# Patient Record
Sex: Male | Born: 1980 | Marital: Married | Smoking: Former smoker
Health system: Southern US, Community
[De-identification: ages and names within clinical notes are randomized; demographics above are authoritative.]

## PROBLEM LIST (undated history)

## (undated) DIAGNOSIS — S12300A Unspecified displaced fracture of fourth cervical vertebra, initial encounter for closed fracture: Secondary | ICD-10-CM

## (undated) DIAGNOSIS — S7291XA Unspecified fracture of right femur, initial encounter for closed fracture: Secondary | ICD-10-CM

## (undated) DIAGNOSIS — S12400A Unspecified displaced fracture of fifth cervical vertebra, initial encounter for closed fracture: Secondary | ICD-10-CM

---

## 2011-02-14 ENCOUNTER — Emergency Department (HOSPITAL_COMMUNITY)
Admission: EM | Admit: 2011-02-14 | Discharge: 2011-02-15 | Disposition: A | Discharge status: Home / Self Care | Payer: Self-pay | Attending: Emergency Medicine | Admitting: Emergency Medicine

## 2011-02-14 DIAGNOSIS — L02419 Cutaneous abscess of limb, unspecified: Secondary | ICD-10-CM | POA: Insufficient documentation

## 2011-02-14 DIAGNOSIS — L03119 Cellulitis of unspecified part of limb: Secondary | ICD-10-CM | POA: Insufficient documentation

## 2011-05-31 ENCOUNTER — Encounter: Payer: Self-pay | Admitting: Emergency Medicine

## 2011-05-31 ENCOUNTER — Emergency Department (INDEPENDENT_AMBULATORY_CARE_PROVIDER_SITE_OTHER)
Admission: EM | Admit: 2011-05-31 | Discharge: 2011-05-31 | Disposition: A | Discharge status: Home / Self Care | Payer: Self-pay | Source: Home / Self Care | Attending: Emergency Medicine | Admitting: Emergency Medicine

## 2011-05-31 DIAGNOSIS — L0291 Cutaneous abscess, unspecified: Secondary | ICD-10-CM

## 2011-05-31 DIAGNOSIS — L0390 Cellulitis, unspecified: Secondary | ICD-10-CM

## 2011-05-31 MED ORDER — SULFAMETHOXAZOLE-TMP DS 800-160 MG PO TABS: 2.0000 | ORAL_TABLET | Freq: Two times a day (BID) | ORAL | Status: AC

## 2011-05-31 MED ORDER — MUPIROCIN 2 % EX OINT: TOPICAL_OINTMENT | Freq: Three times a day (TID) | CUTANEOUS | Status: AC

## 2011-05-31 NOTE — ED Notes (Signed)
Pt here with cellulitis of left forearm with itching after poss spider bite last Friday.redness and tenderness seen c/o 2/10 achy pain with bending only

## 2011-05-31 NOTE — ED Provider Notes (Signed)
History     CSN: [id number] Arrival date & time: 05/31/2011  8:19 PM   First MD Initiated Contact with Patient 05/31/11 2015      Chief Complaint  Patient presents with  . Insect Bite  . Cellulitis    (Consider location/radiation/quality/duration/timing/severity/associated sxs/prior treatment) HPI Comments: Mr. Winningham has had a four-day history of a painful, red, swollen nodule just above his elbow. This is tender to touch. It has not drained any pus. He denies any fever or chills. He denies any lesions elsewhere. He had a similar lesion on his left thigh about 2 months ago. Your friend also had a similar lesion as well. He's not sure if he had MRSA or not.   History reviewed. No pertinent past medical history.  History reviewed. No pertinent past surgical history.  History reviewed. No pertinent family history.  History  Substance Use Topics  . Smoking status: Current Everyday Smoker  . Smokeless tobacco: Not on file  . Alcohol Use: Yes      Review of Systems  Constitutional: Negative for fever and chills.  Skin: Negative for color change, pallor, rash and wound.    Allergies  Review of patient's allergies indicates not on file.  Home Medications   Current Outpatient Rx  Name Route Sig Dispense Refill  . MUPIROCIN 2 % EX OINT Topical Apply topically 3 (three) times daily. 22 g 0  . SULFAMETHOXAZOLE-TMP DS 800-160 MG PO TABS Oral Take 2 tablets by mouth 2 (two) times daily. 40 tablet 0    BP 162/96  Pulse 76  Temp(Src) 98.5 F (36.9 C) (Oral)  Resp 18  SpO2 100%  Physical Exam  Nursing note and vitals reviewed. Constitutional: He appears well-developed and well-nourished. No distress.  Skin: Skin is warm and dry. No abrasion, no bruising, no ecchymosis, no lesion and no rash noted. He is not diaphoretic. No erythema. No pallor.       Just proximal to the elbow there was a 1 cm, tender, raised nodule with a small central ulceration. There was no fluctuance  or purulent drainage.    ED Course  Procedures (including critical care time)  Labs Reviewed - No data to display No results found.   1. Cellulitis       MDM  This appears to be a MRSA infection. He probably had one on his thigh previously. In addition his girlfriend may have the same thing. He'll be treated with a ten-day course of Bactrim, 2 tablets twice daily and mupirocin ointment to be applied to the lesion until clear and to both nostrils twice daily for a month.        Roque Lias, MD 05/31/11 [phone]

## 2012-06-21 DIAGNOSIS — S7291XA Unspecified fracture of right femur, initial encounter for closed fracture: Secondary | ICD-10-CM

## 2012-06-21 HISTORY — DX: Unspecified fracture of right femur, initial encounter for closed fracture: S72.91XA

## 2013-03-27 ENCOUNTER — Emergency Department (HOSPITAL_COMMUNITY): Payer: No Typology Code available for payment source

## 2013-03-27 ENCOUNTER — Inpatient Hospital Stay (HOSPITAL_COMMUNITY)
Admission: EM | Admit: 2013-03-27 | Discharge: 2013-03-30 | DRG: 481 | Disposition: A | Discharge status: Home / Self Care | Payer: No Typology Code available for payment source | Attending: Orthopedic Surgery | Admitting: Orthopedic Surgery

## 2013-03-27 ENCOUNTER — Inpatient Hospital Stay (HOSPITAL_COMMUNITY): Payer: No Typology Code available for payment source

## 2013-03-27 ENCOUNTER — Encounter (HOSPITAL_COMMUNITY): Payer: Self-pay | Admitting: Certified Registered"

## 2013-03-27 ENCOUNTER — Encounter (HOSPITAL_COMMUNITY): Payer: Self-pay | Admitting: Cardiology

## 2013-03-27 ENCOUNTER — Inpatient Hospital Stay (HOSPITAL_COMMUNITY): Payer: No Typology Code available for payment source | Admitting: Certified Registered"

## 2013-03-27 ENCOUNTER — Encounter (HOSPITAL_COMMUNITY): Admission: EM | Disposition: A | Discharge status: Home / Self Care | Payer: Self-pay | Source: Home / Self Care

## 2013-03-27 DIAGNOSIS — N189 Chronic kidney disease, unspecified: Secondary | ICD-10-CM | POA: Diagnosis present

## 2013-03-27 DIAGNOSIS — S72309A Unspecified fracture of shaft of unspecified femur, initial encounter for closed fracture: Principal | ICD-10-CM | POA: Diagnosis present

## 2013-03-27 DIAGNOSIS — D62 Acute posthemorrhagic anemia: Secondary | ICD-10-CM | POA: Diagnosis not present

## 2013-03-27 DIAGNOSIS — K5900 Constipation, unspecified: Secondary | ICD-10-CM | POA: Diagnosis not present

## 2013-03-27 DIAGNOSIS — S12400A Unspecified displaced fracture of fifth cervical vertebra, initial encounter for closed fracture: Secondary | ICD-10-CM

## 2013-03-27 DIAGNOSIS — F17200 Nicotine dependence, unspecified, uncomplicated: Secondary | ICD-10-CM | POA: Diagnosis present

## 2013-03-27 DIAGNOSIS — N182 Chronic kidney disease, stage 2 (mild): Secondary | ICD-10-CM | POA: Diagnosis present

## 2013-03-27 DIAGNOSIS — S12300A Unspecified displaced fracture of fourth cervical vertebra, initial encounter for closed fracture: Secondary | ICD-10-CM | POA: Diagnosis present

## 2013-03-27 DIAGNOSIS — S7291XA Unspecified fracture of right femur, initial encounter for closed fracture: Secondary | ICD-10-CM

## 2013-03-27 DIAGNOSIS — S7290XA Unspecified fracture of unspecified femur, initial encounter for closed fracture: Secondary | ICD-10-CM

## 2013-03-27 DIAGNOSIS — S129XXA Fracture of neck, unspecified, initial encounter: Secondary | ICD-10-CM

## 2013-03-27 HISTORY — PX: FEMUR IM NAIL: SHX1597

## 2013-03-27 HISTORY — DX: Unspecified displaced fracture of fourth cervical vertebra, initial encounter for closed fracture: S12.300A

## 2013-03-27 HISTORY — DX: Unspecified displaced fracture of fifth cervical vertebra, initial encounter for closed fracture: S12.400A

## 2013-03-27 LAB — URINALYSIS, ROUTINE W REFLEX MICROSCOPIC
Glucose, UA: NEGATIVE mg/dL
Leukocytes, UA: NEGATIVE
Nitrite: NEGATIVE
Protein, ur: NEGATIVE mg/dL
pH: 6.5 (ref 5.0–8.0)

## 2013-03-27 LAB — COMPREHENSIVE METABOLIC PANEL
ALT: 19 U/L (ref 0–53)
AST: 24 U/L (ref 0–37)
Albumin: 3.7 g/dL (ref 3.5–5.2)
Calcium: 8.6 mg/dL (ref 8.4–10.5)
Sodium: 136 mEq/L (ref 135–145)
Total Protein: 7.2 g/dL (ref 6.0–8.3)

## 2013-03-27 LAB — PROTIME-INR: INR: 1 (ref 0.00–1.49)

## 2013-03-27 LAB — CBC
HCT: 45.7 % (ref 39.0–52.0)
Hemoglobin: 15.8 g/dL (ref 13.0–17.0)
MCH: 30.7 pg (ref 26.0–34.0)
MCHC: 34.6 g/dL (ref 30.0–36.0)
RBC: 5.14 MIL/uL (ref 4.22–5.81)

## 2013-03-27 LAB — POCT I-STAT, CHEM 8
Calcium, Ion: 1.16 mmol/L (ref 1.12–1.23)
Chloride: 104 mEq/L (ref 96–112)
Glucose, Bld: 115 mg/dL — ABNORMAL HIGH (ref 70–99)
HCT: 52 % (ref 39.0–52.0)
Hemoglobin: 17.7 g/dL — ABNORMAL HIGH (ref 13.0–17.0)

## 2013-03-27 LAB — MRSA PCR SCREENING: MRSA by PCR: NEGATIVE

## 2013-03-27 LAB — SAMPLE TO BLOOD BANK

## 2013-03-27 LAB — CG4 I-STAT (LACTIC ACID): Lactic Acid, Venous: 2.14 mmol/L (ref 0.5–2.2)

## 2013-03-27 SURGERY — INSERTION, INTRAMEDULLARY ROD, FEMUR
Anesthesia: General | Site: Leg Upper | Laterality: Right | Wound class: Clean

## 2013-03-27 MED ORDER — ACETAMINOPHEN 325 MG PO TABS
650.0000 mg | ORAL_TABLET | Freq: Four times a day (QID) | ORAL | Status: DC | PRN
  Administered 2013-03-28: 650 mg via ORAL
  Filled 2013-03-27: qty 2

## 2013-03-27 MED ORDER — HYDROMORPHONE HCL PF 1 MG/ML IJ SOLN
1.0000 mg | Freq: Once | INTRAMUSCULAR | Status: AC
  Administered 2013-03-27: 1 mg via INTRAVENOUS
  Filled 2013-03-27: qty 1

## 2013-03-27 MED ORDER — CHLORHEXIDINE GLUCONATE 4 % EX LIQD
60.0000 mL | Freq: Once | CUTANEOUS | Status: DC
  Filled 2013-03-27: qty 60

## 2013-03-27 MED ORDER — DOCUSATE SODIUM 100 MG PO CAPS
100.0000 mg | ORAL_CAPSULE | Freq: Two times a day (BID) | ORAL | Status: DC
  Administered 2013-03-28 – 2013-03-30 (×4): 100 mg via ORAL
  Filled 2013-03-27 (×7): qty 1

## 2013-03-27 MED ORDER — ACETAMINOPHEN 650 MG RE SUPP: 650.0000 mg | Freq: Four times a day (QID) | RECTAL | Status: DC | PRN

## 2013-03-27 MED ORDER — PROPOFOL 10 MG/ML IV BOLUS
INTRAVENOUS | Status: DC | PRN
  Administered 2013-03-27: 200 mg via INTRAVENOUS

## 2013-03-27 MED ORDER — CEFAZOLIN SODIUM 1-5 GM-% IV SOLN
INTRAVENOUS | Status: AC
  Filled 2013-03-27: qty 50

## 2013-03-27 MED ORDER — FENTANYL CITRATE 0.05 MG/ML IJ SOLN
INTRAMUSCULAR | Status: DC | PRN
  Administered 2013-03-27: 100 ug via INTRAVENOUS
  Administered 2013-03-27: 50 ug via INTRAVENOUS
  Administered 2013-03-27: 100 ug via INTRAVENOUS
  Administered 2013-03-27: 50 ug via INTRAVENOUS
  Administered 2013-03-27: 100 ug via INTRAVENOUS

## 2013-03-27 MED ORDER — SODIUM CHLORIDE 0.9 % IV BOLUS (SEPSIS)
1000.0000 mL | Freq: Once | INTRAVENOUS | Status: DC
  Administered 2013-03-27: 1000 mL via INTRAVENOUS

## 2013-03-27 MED ORDER — LIDOCAINE HCL (CARDIAC) 20 MG/ML IV SOLN
INTRAVENOUS | Status: DC | PRN
  Administered 2013-03-27: 80 mg via INTRAVENOUS

## 2013-03-27 MED ORDER — PANTOPRAZOLE SODIUM 40 MG IV SOLR
40.0000 mg | Freq: Every day | INTRAVENOUS | Status: DC
  Filled 2013-03-27 (×2): qty 40

## 2013-03-27 MED ORDER — POTASSIUM CHLORIDE IN NACL 20-0.45 MEQ/L-% IV SOLN
INTRAVENOUS | Status: DC
  Administered 2013-03-27 – 2013-03-28 (×2): via INTRAVENOUS
  Filled 2013-03-27 (×7): qty 1000

## 2013-03-27 MED ORDER — ONDANSETRON HCL 4 MG PO TABS: 4.0000 mg | ORAL_TABLET | Freq: Four times a day (QID) | ORAL | Status: DC | PRN

## 2013-03-27 MED ORDER — FENTANYL CITRATE 0.05 MG/ML IJ SOLN
INTRAMUSCULAR | Status: AC
  Filled 2013-03-27: qty 2

## 2013-03-27 MED ORDER — OXYCODONE HCL 5 MG PO TABS
5.0000 mg | ORAL_TABLET | ORAL | Status: DC | PRN
  Administered 2013-03-28: 10 mg via ORAL
  Filled 2013-03-27 (×2): qty 2

## 2013-03-27 MED ORDER — 0.9 % SODIUM CHLORIDE (POUR BTL) OPTIME
TOPICAL | Status: DC | PRN
  Administered 2013-03-27: 1000 mL

## 2013-03-27 MED ORDER — HYDROMORPHONE HCL PF 1 MG/ML IJ SOLN
INTRAMUSCULAR | Status: AC
  Filled 2013-03-27: qty 1

## 2013-03-27 MED ORDER — TEMAZEPAM 15 MG PO CAPS: 15.0000 mg | ORAL_CAPSULE | Freq: Every evening | ORAL | Status: DC | PRN

## 2013-03-27 MED ORDER — OXYCODONE HCL 5 MG PO TABS
5.0000 mg | ORAL_TABLET | ORAL | Status: DC | PRN
  Administered 2013-03-28 – 2013-03-29 (×2): 10 mg via ORAL
  Filled 2013-03-27: qty 2

## 2013-03-27 MED ORDER — METHOCARBAMOL 500 MG PO TABS
500.0000 mg | ORAL_TABLET | Freq: Four times a day (QID) | ORAL | Status: DC | PRN
  Administered 2013-03-29 – 2013-03-30 (×3): 500 mg via ORAL
  Filled 2013-03-27 (×3): qty 1

## 2013-03-27 MED ORDER — PHENOL 1.4 % MT LIQD: 1.0000 | OROMUCOSAL | Status: DC | PRN

## 2013-03-27 MED ORDER — OXYCODONE HCL 5 MG/5ML PO SOLN: 5.0000 mg | Freq: Once | ORAL | Status: DC | PRN

## 2013-03-27 MED ORDER — TETANUS-DIPHTH-ACELL PERTUSSIS 5-2.5-18.5 LF-MCG/0.5 IM SUSP
0.5000 mL | Freq: Once | INTRAMUSCULAR | Status: AC
  Administered 2013-03-27: 0.5 mL via INTRAMUSCULAR
  Filled 2013-03-27: qty 0.5

## 2013-03-27 MED ORDER — MENTHOL 3 MG MT LOZG: 1.0000 | LOZENGE | OROMUCOSAL | Status: DC | PRN

## 2013-03-27 MED ORDER — SODIUM CHLORIDE 0.9 % IV SOLN: Freq: Once | INTRAVENOUS | Status: DC

## 2013-03-27 MED ORDER — ONDANSETRON HCL 4 MG/2ML IJ SOLN
4.0000 mg | Freq: Four times a day (QID) | INTRAMUSCULAR | Status: DC | PRN
  Administered 2013-03-30: 4 mg via INTRAVENOUS
  Filled 2013-03-27 (×2): qty 2

## 2013-03-27 MED ORDER — DEXTROSE 5 % IV SOLN
3.0000 g | INTRAVENOUS | Status: DC
  Filled 2013-03-27: qty 3000

## 2013-03-27 MED ORDER — METOCLOPRAMIDE HCL 5 MG PO TABS
5.0000 mg | ORAL_TABLET | Freq: Three times a day (TID) | ORAL | Status: DC | PRN
  Filled 2013-03-27: qty 2

## 2013-03-27 MED ORDER — OXYCODONE HCL 5 MG PO TABS
ORAL_TABLET | ORAL | Status: AC
  Administered 2013-03-27: 5 mg
  Filled 2013-03-27: qty 1

## 2013-03-27 MED ORDER — OXYCODONE HCL 5 MG PO TABS: 5.0000 mg | ORAL_TABLET | Freq: Once | ORAL | Status: DC | PRN

## 2013-03-27 MED ORDER — ONDANSETRON HCL 4 MG/2ML IJ SOLN: 4.0000 mg | Freq: Four times a day (QID) | INTRAMUSCULAR | Status: DC | PRN

## 2013-03-27 MED ORDER — PANTOPRAZOLE SODIUM 40 MG PO TBEC
40.0000 mg | DELAYED_RELEASE_TABLET | Freq: Every day | ORAL | Status: DC
  Administered 2013-03-28: 40 mg via ORAL
  Filled 2013-03-27: qty 1

## 2013-03-27 MED ORDER — ONDANSETRON HCL 4 MG/2ML IJ SOLN
INTRAMUSCULAR | Status: DC | PRN
  Administered 2013-03-27: 4 mg via INTRAMUSCULAR

## 2013-03-27 MED ORDER — ENOXAPARIN SODIUM 30 MG/0.3ML ~~LOC~~ SOLN: 30.0000 mg | Freq: Two times a day (BID) | SUBCUTANEOUS | Status: DC

## 2013-03-27 MED ORDER — HYDROMORPHONE HCL PF 1 MG/ML IJ SOLN
0.2500 mg | INTRAMUSCULAR | Status: DC | PRN
  Administered 2013-03-27 (×2): 0.5 mg via INTRAVENOUS

## 2013-03-27 MED ORDER — METHOCARBAMOL 100 MG/ML IJ SOLN
500.0000 mg | Freq: Four times a day (QID) | INTRAVENOUS | Status: DC | PRN
  Filled 2013-03-27: qty 5

## 2013-03-27 MED ORDER — IOHEXOL 350 MG/ML SOLN
50.0000 mL | Freq: Once | INTRAVENOUS | Status: AC | PRN
  Administered 2013-03-27: 50 mL via INTRAVENOUS

## 2013-03-27 MED ORDER — ASPIRIN EC 325 MG PO TBEC
325.0000 mg | DELAYED_RELEASE_TABLET | Freq: Every day | ORAL | Status: DC
  Administered 2013-03-28 – 2013-03-30 (×3): 325 mg via ORAL
  Filled 2013-03-27 (×4): qty 1

## 2013-03-27 MED ORDER — HYDROMORPHONE HCL PF 1 MG/ML IJ SOLN
0.5000 mg | INTRAMUSCULAR | Status: DC | PRN
Start: 2013-03-27 — End: 2013-03-30
  Administered 2013-03-28 – 2013-03-29 (×2): 0.5 mg via INTRAVENOUS
  Filled 2013-03-27 (×2): qty 1

## 2013-03-27 MED ORDER — CEFAZOLIN SODIUM-DEXTROSE 2-3 GM-% IV SOLR
INTRAVENOUS | Status: AC
  Administered 2013-03-27: 3 g via INTRAVENOUS
  Filled 2013-03-27: qty 50

## 2013-03-27 MED ORDER — SUCCINYLCHOLINE CHLORIDE 20 MG/ML IJ SOLN
INTRAMUSCULAR | Status: DC | PRN
  Administered 2013-03-27: 120 mg via INTRAVENOUS

## 2013-03-27 MED ORDER — POLYETHYLENE GLYCOL 3350 17 G PO PACK
17.0000 g | PACK | Freq: Every day | ORAL | Status: DC
  Administered 2013-03-29 – 2013-03-30 (×2): 17 g via ORAL
  Filled 2013-03-27 (×6): qty 1

## 2013-03-27 MED ORDER — MORPHINE SULFATE 2 MG/ML IJ SOLN
0.5000 mg | INTRAMUSCULAR | Status: DC | PRN
  Administered 2013-03-27 – 2013-03-28 (×2): 0.5 mg via INTRAVENOUS
  Filled 2013-03-27 (×2): qty 1

## 2013-03-27 MED ORDER — FENTANYL CITRATE 0.05 MG/ML IJ SOLN
50.0000 ug | Freq: Once | INTRAMUSCULAR | Status: AC
  Administered 2013-03-27: 50 ug via INTRAVENOUS

## 2013-03-27 MED ORDER — METOCLOPRAMIDE HCL 5 MG/ML IJ SOLN
5.0000 mg | Freq: Three times a day (TID) | INTRAMUSCULAR | Status: DC | PRN
  Filled 2013-03-27: qty 2

## 2013-03-27 MED ORDER — LACTATED RINGERS IV SOLN
INTRAVENOUS | Status: DC | PRN
  Administered 2013-03-27 (×2): via INTRAVENOUS

## 2013-03-27 MED ORDER — MIDAZOLAM HCL 5 MG/5ML IJ SOLN
INTRAMUSCULAR | Status: DC | PRN
  Administered 2013-03-27: 2 mg via INTRAVENOUS

## 2013-03-27 MED ORDER — PROMETHAZINE HCL 25 MG/ML IJ SOLN: 6.2500 mg | INTRAMUSCULAR | Status: DC | PRN

## 2013-03-27 SURGICAL SUPPLY — 42 items
BANDAGE CONFORM 3  STR LF (GAUZE/BANDAGES/DRESSINGS) ×4 IMPLANT
BIT DRILL 3.8X6 NS (BIT) ×1 IMPLANT
BIT DRILL 5.3 NS (BIT) ×1 IMPLANT
BLADE SURG 15 STRL LF DISP TIS (BLADE) ×1 IMPLANT
BLADE SURG 15 STRL SS (BLADE) ×2
BNDG COHESIVE 6X5 TAN STRL LF (GAUZE/BANDAGES/DRESSINGS) ×1 IMPLANT
CLOTH BEACON ORANGE TIMEOUT ST (SAFETY) ×2 IMPLANT
CLSR STERI-STRIP ANTIMIC 1/2X4 (GAUZE/BANDAGES/DRESSINGS) ×1 IMPLANT
DRAPE INCISE IOBAN 66X45 STRL (DRAPES) ×2 IMPLANT
DRAPE PROXIMA HALF (DRAPES) ×1 IMPLANT
DRAPE STERI IOBAN 125X83 (DRAPES) ×2 IMPLANT
DRSG ADAPTIC 3X8 NADH LF (GAUZE/BANDAGES/DRESSINGS) ×2 IMPLANT
DRSG MEPILEX BORDER 4X4 (GAUZE/BANDAGES/DRESSINGS) ×3 IMPLANT
DRSG MEPILEX BORDER 4X8 (GAUZE/BANDAGES/DRESSINGS) ×2 IMPLANT
ELECT REM PT RETURN 9FT ADLT (ELECTROSURGICAL) ×2
ELECTRODE REM PT RTRN 9FT ADLT (ELECTROSURGICAL) ×1 IMPLANT
GLOVE BIO SURGEON STRL SZ7.5 (GLOVE) ×2 IMPLANT
GLOVE BIO SURGEON STRL SZ8 (GLOVE) ×2 IMPLANT
GLOVE EUDERMIC 7 POWDERFREE (GLOVE) ×4 IMPLANT
GLOVE SS BIOGEL STRL SZ 7.5 (GLOVE) ×1 IMPLANT
GLOVE SUPERSENSE BIOGEL SZ 7.5 (GLOVE) ×2
GOWN STRL NON-REIN LRG LVL3 (GOWN DISPOSABLE) ×5 IMPLANT
GOWN STRL REIN XL XLG (GOWN DISPOSABLE) ×4 IMPLANT
GUIDEPIN 3.2X17.5 THRD DISP (PIN) ×1 IMPLANT
GUIDEWIRE BALL NOSE 100CM (WIRE) ×2 IMPLANT
KIT BASIN OR (CUSTOM PROCEDURE TRAY) ×2 IMPLANT
KIT ROOM TURNOVER OR (KITS) ×2 IMPLANT
NAIL VERSANIAL TROCK 9X42 (Nail) ×1 IMPLANT
NS IRRIG 1000ML POUR BTL (IV SOLUTION) ×2 IMPLANT
PACK GENERAL/GYN (CUSTOM PROCEDURE TRAY) ×2 IMPLANT
PAD ARMBOARD 7.5X6 YLW CONV (MISCELLANEOUS) ×4 IMPLANT
SCREW ACE CORTICAL 6.5X70MML (Screw) ×1 IMPLANT
SCREW ACECAP 42MM (Screw) ×1 IMPLANT
SCREWDRIVER HEX TIP 3.5MM (MISCELLANEOUS) ×1 IMPLANT
SPONGE LAP 18X18 X RAY DECT (DISPOSABLE) ×2 IMPLANT
STAPLER VISISTAT 35W (STAPLE) ×4 IMPLANT
STRIP CLOSURE SKIN 1/2X4 (GAUZE/BANDAGES/DRESSINGS) ×2 IMPLANT
SUT MNCRL AB 3-0 PS2 18 (SUTURE) ×2 IMPLANT
SUT VIC AB 0 CT1 27 (SUTURE) ×2
SUT VIC AB 0 CT1 27XBRD ANBCTR (SUTURE) ×1 IMPLANT
SUT VIC AB 2-0 CT1 27 (SUTURE) ×2
SUT VIC AB 2-0 CT1 TAPERPNT 27 (SUTURE) ×1 IMPLANT

## 2013-03-27 NOTE — ED Notes (Signed)
Pulses palpated in the right lower extremity. Cap refill less than 3 seconds.
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
## 2013-03-27 NOTE — ED Notes (Signed)
Pt to CT scanner

## 2013-03-27 NOTE — Anesthesia Procedure Notes (Signed)
Procedure Name: Intubation Date/Time: 03/27/2013 7:45 PM Performed by: Molli Hazard Pre-anesthesia Checklist: Patient identified, Emergency Drugs available, Suction available and Patient being monitored Patient Re-evaluated:Patient Re-evaluated prior to inductionOxygen Delivery Method: Circle system utilized Preoxygenation: Pre-oxygenation with 100% oxygen Intubation Type: IV induction Ventilation: Mask ventilation without difficulty Laryngoscope size: glidescope. Grade View: Grade I Tube type: Oral Tube size: 8.0 mm Number of attempts: 1 Airway Equipment and Method: Stylet and Video-laryngoscopy Placement Confirmation: ETT inserted through vocal cords under direct vision,  positive ETCO2 and breath sounds checked- equal and bilateral Secured at: 22 cm Tube secured with: Tape Dental Injury: Teeth and Oropharynx as per pre-operative assessment  Comments: Front of cervical collar removed for intubation. Head and neck remained neutral throughout. Easily intubated using Glidescope and collar replaced.
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
## 2013-03-27 NOTE — ED Notes (Signed)
Family at beside. Family given emotional support. 

## 2013-03-27 NOTE — Anesthesia Postprocedure Evaluation (Signed)
  Anesthesia Post-op Note  Patient: Hector Wallace  Procedure(s) Performed: Procedure(s): INTRAMEDULLARY (IM) NAIL FEMORAL (Right)  Patient Location: PACU  Anesthesia Type:General  Level of Consciousness: awake  Airway and Oxygen Therapy: Patient Spontanous Breathing  Post-op Pain: mild  Post-op Assessment: Post-op Vital signs reviewed  Post-op Vital Signs: stable  Complications: No apparent anesthesia complications

## 2013-03-27 NOTE — ED Notes (Signed)
Pt at CT

## 2013-03-27 NOTE — Transfer of Care (Signed)
Immediate Anesthesia Transfer of Care Note  Patient: Hector Wallace  Procedure(s) Performed: Procedure(s): INTRAMEDULLARY (IM) NAIL FEMORAL (Right)  Patient Location: PACU  Anesthesia Type:General  Level of Consciousness: awake, sedated and patient cooperative  Airway & Oxygen Therapy: Patient Spontanous Breathing and Patient connected to nasal cannula oxygen  Post-op Assessment: Report given to PACU RN, Post -op Vital signs reviewed and stable and Patient moving all extremities X 4  Post vital signs: Reviewed and stable  Complications: No apparent anesthesia complications
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
## 2013-03-27 NOTE — Op Note (Signed)
03/27/2013  9:39 PM  PATIENT:   Hector Wallace  31 y.o. male  PRE-OPERATIVE DIAGNOSIS:  RIGHT FEMUR FRACTURE  POST-OPERATIVE DIAGNOSIS:  same  PROCEDURE:  Reamed, statically locked right femoral intramedullary nailing  SURGEON:  Safiya Girdler, Vania Rea M.D.  ASSISTANTS: [place]ord pac   ANESTHESIA:   GET  EBL: 300  SPECIMEN:  none  Drains: none   PATIENT DISPOSITION:  PACU - hemodynamically stable.    PLAN OF CARE: Admit to inpatient   Dictation# [id number], [phone]

## 2013-03-27 NOTE — ED Notes (Signed)
Aspen Collar applied with Dr. Littie Deeds at the bedside. Family informed of pt status. Waiting for consulting providers.

## 2013-03-27 NOTE — ED Notes (Signed)
Pt c/o pain where traction strap was touching right medial foot. 4x4 placed between foot and buckle/strap of traction device, pt states foot feels much better at this time.

## 2013-03-27 NOTE — ED Notes (Addendum)
X-ray at the bedside. Pt given warm blankets.
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
## 2013-03-27 NOTE — Anesthesia Preprocedure Evaluation (Addendum)
Anesthesia Evaluation  Patient identified by MRN, date of birth, ID band Patient awake    Reviewed: Allergy & Precautions, H&P , NPO status , Patient's Chart, lab work & pertinent test results  History of Anesthesia Complications Negative for: history of anesthetic complications  Airway Mallampati: I  Neck ROM: Limited   Comment: In cervical collar Dental  (+) Teeth Intact and Dental Advisory Given   Pulmonary Current Smoker,  breath sounds clear to auscultation        Cardiovascular Rhythm:Regular Rate:Normal     Neuro/Psych Noted cerv spine CT and C4-C5 laminae fx,can extend head slightly    GI/Hepatic   Endo/Other    Renal/GU      Musculoskeletal   Abdominal   Peds  Hematology   Anesthesia Other Findings   Reproductive/Obstetrics                          Anesthesia Physical Anesthesia Plan  ASA: II and emergent  Anesthesia Plan: General   Post-op Pain Management:    Induction: Intravenous, Rapid sequence and Cricoid pressure planned  Airway Management Planned: Oral ETT and Video Laryngoscope Planned  Additional Equipment:   Intra-op Plan:   Post-operative Plan: Extubation in OR  Informed Consent:   Dental advisory given  Plan Discussed with: Anesthesiologist and Surgeon  Anesthesia Plan Comments:         Anesthesia Quick Evaluation

## 2013-03-27 NOTE — H&P (Signed)
Pt seen and examined agree with above
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
## 2013-03-27 NOTE — ED Notes (Addendum)
Ortho PA at the bedside 

## 2013-03-27 NOTE — ED Notes (Signed)
CT contacted regarding delay in imaging.
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
## 2013-03-27 NOTE — Preoperative (Signed)
Beta Blockers   Reason not to administer Beta Blockers:Not Applicable 

## 2013-03-28 DIAGNOSIS — N289 Disorder of kidney and ureter, unspecified: Secondary | ICD-10-CM

## 2013-03-28 LAB — BASIC METABOLIC PANEL
BUN: 11 mg/dL (ref 6–23)
CO2: 24 mEq/L (ref 19–32)
Chloride: 104 mEq/L (ref 96–112)
Creatinine, Ser: 1.39 mg/dL — ABNORMAL HIGH (ref 0.50–1.35)
GFR calc Af Amer: 75 mL/min — ABNORMAL LOW (ref >90)
GFR calc non Af Amer: 65 mL/min — ABNORMAL LOW (ref >90)
Potassium: 4.1 mEq/L (ref 3.5–5.1)
Sodium: 137 mEq/L (ref 135–145)

## 2013-03-28 LAB — CBC
HCT: 38.2 % — ABNORMAL LOW (ref 39.0–52.0)
Hemoglobin: 14.1 g/dL (ref 13.0–17.0)
MCH: 32.6 pg (ref 26.0–34.0)
MCHC: 36.9 g/dL — ABNORMAL HIGH (ref 30.0–36.0)
MCV: 88.4 fL (ref 78.0–100.0)
RBC: 4.32 MIL/uL (ref 4.22–5.81)
WBC: 10.1 10*3/uL (ref 4.0–10.5)

## 2013-03-28 LAB — URINALYSIS, ROUTINE W REFLEX MICROSCOPIC
Glucose, UA: NEGATIVE mg/dL
Nitrite: NEGATIVE
Protein, ur: NEGATIVE mg/dL
Urobilinogen, UA: 0.2 mg/dL (ref 0.0–1.0)

## 2013-03-28 LAB — CDS SEROLOGY

## 2013-03-28 LAB — URINE MICROSCOPIC-ADD ON

## 2013-03-28 MED ORDER — FONDAPARINUX SODIUM 2.5 MG/0.5ML ~~LOC~~ SOLN
2.5000 mg | SUBCUTANEOUS | Status: DC
  Administered 2013-03-28 – 2013-03-30 (×3): 2.5 mg via SUBCUTANEOUS
  Filled 2013-03-28 (×3): qty 0.5
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
## 2013-03-28 NOTE — Progress Notes (Signed)
UR completed.  Gwenith Tschida, RN BSN [facility] CCM Trauma/Neuro ICU Case Manager [phone]  

## 2013-03-28 NOTE — Op Note (Signed)
NAMEMarland Kitchen  KORIE, BRABSON NO.:  [id number]  MEDICAL RECORD NO.:  [id number]  LOCATION:  3M02C                        FACILITY:  [facility]  PHYSICIAN:  Vania Rea. Rebekkah Powless, M.D.  DATE OF BIRTH:  09-Jun-1981  DATE OF PROCEDURE:  03/27/2013 DATE OF DISCHARGE:                              OPERATIVE REPORT   ADDENDUM:  French Ana A. Shuford, PA-C, was used an Geophysicist/field seismologist throughout this case, essential for help with positioning of the patient, manipulation of extremity, and assistance with the reduction maneuvers to successfully pass the guide wire and femoral nail and as well as for wound closure, and intraoperative decision making, and vital importance to the success of the procedure.     Vania Rea. Yulissa Needham, M.D.     KMS/MEDQ  D:  03/27/2013  T:  03/28/2013  Job:  [id number]
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
## 2013-03-28 NOTE — Progress Notes (Signed)
Should do okay with therapy.  Needs collars for C-5 pedicle fracture.  PT/OT ordered.  Should be able to transfer to 5N.  This patient has been seen and I agree with the findings and treatment plan.  Marta Lamas. Gae Bon, MD, FACS [phone] (pager) [phone] (direct pager) Trauma Surgeon
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
## 2013-03-29 DIAGNOSIS — N189 Chronic kidney disease, unspecified: Secondary | ICD-10-CM

## 2013-03-29 DIAGNOSIS — N182 Chronic kidney disease, stage 2 (mild): Secondary | ICD-10-CM | POA: Diagnosis present

## 2013-03-29 LAB — CBC
HCT: 35.8 % — ABNORMAL LOW (ref 39.0–52.0)
Hemoglobin: 13 g/dL (ref 13.0–17.0)
MCH: 31.9 pg (ref 26.0–34.0)
MCHC: 36.3 g/dL — ABNORMAL HIGH (ref 30.0–36.0)
MCV: 88 fL (ref 78.0–100.0)
RDW: 13.2 % (ref 11.5–15.5)

## 2013-03-29 LAB — BASIC METABOLIC PANEL
BUN: 11 mg/dL (ref 6–23)
Calcium: 8.1 mg/dL — ABNORMAL LOW (ref 8.4–10.5)
Creatinine, Ser: 1.41 mg/dL — ABNORMAL HIGH (ref 0.50–1.35)
GFR calc Af Amer: 74 mL/min — ABNORMAL LOW (ref >90)
GFR calc non Af Amer: 64 mL/min — ABNORMAL LOW (ref >90)
Glucose, Bld: 155 mg/dL — ABNORMAL HIGH (ref 70–99)
Potassium: 4 mEq/L (ref 3.5–5.1)

## 2013-03-29 MED ORDER — MORPHINE SULFATE 2 MG/ML IJ SOLN: 2.0000 mg | INTRAMUSCULAR | Status: DC | PRN

## 2013-03-29 MED ORDER — IPRATROPIUM BROMIDE 0.02 % IN SOLN
RESPIRATORY_TRACT | Status: AC
  Administered 2013-03-29: 0.5 mg
  Filled 2013-03-29: qty 2.5

## 2013-03-29 MED ORDER — ASPIRIN 325 MG PO TBEC: 325.0000 mg | DELAYED_RELEASE_TABLET | Freq: Every day | ORAL | Status: DC

## 2013-03-29 MED ORDER — IPRATROPIUM BROMIDE 0.02 % IN SOLN: 0.5000 mg | Freq: Once | RESPIRATORY_TRACT | Status: DC

## 2013-03-29 MED ORDER — ALBUTEROL SULFATE (5 MG/ML) 0.5% IN NEBU: 2.5000 mg | INHALATION_SOLUTION | Freq: Once | RESPIRATORY_TRACT | Status: DC

## 2013-03-29 MED ORDER — OXYCODONE HCL 5 MG PO TABS
5.0000 mg | ORAL_TABLET | ORAL | Status: DC | PRN
  Administered 2013-03-29 (×2): 10 mg via ORAL
  Administered 2013-03-29 – 2013-03-30 (×3): 5 mg via ORAL
  Administered 2013-03-30 (×4): 10 mg via ORAL
  Filled 2013-03-29: qty 2
  Filled 2013-03-29 (×2): qty 1
  Filled 2013-03-29: qty 2
  Filled 2013-03-29: qty 3
  Filled 2013-03-29: qty 1
  Filled 2013-03-29 (×2): qty 2

## 2013-03-29 MED ORDER — ALBUTEROL SULFATE (5 MG/ML) 0.5% IN NEBU
INHALATION_SOLUTION | RESPIRATORY_TRACT | Status: AC
  Administered 2013-03-29: 2.5 mg
  Filled 2013-03-29: qty 0.5

## 2013-03-29 MED ORDER — BISACODYL 5 MG PO TBEC
5.0000 mg | DELAYED_RELEASE_TABLET | Freq: Every day | ORAL | Status: DC
  Administered 2013-03-29 – 2013-03-30 (×2): 5 mg via ORAL
  Filled 2013-03-29 (×2): qty 1
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
## 2013-03-29 NOTE — Progress Notes (Signed)
Seen, agree with above.   In good spirits, considering.
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
## 2013-03-29 NOTE — Progress Notes (Signed)
Hector Wallace  MRN: [id number] DOB/Age: [birth year] 32 y.o. Physician: Jacquelyne Balint Procedure: Procedure(s) (LRB): INTRAMEDULLARY (IM) NAIL FEMORAL (Right)     Subjective: Up in chair, pain better today and actually ambulated short distance into hallway.  Vital Signs Temp:  [98 F (36.7 C)-101 F (38.3 C)] 98.9 F (37.2 C) (10/09 1131) Pulse Rate:  [86-98] 96 (10/09 1100) Resp:  [15-26] 23 (10/09 1100) BP: (118-149)/(55-98) 129/80 mmHg (10/09 1100) SpO2:  [93 %-99 %] 98 % (10/09 1100)  Lab Results  Recent Labs  03/28/13 0538 03/29/13 0550  WBC 10.1 11.2*  HGB 14.1 13.0  HCT 38.2* 35.8*  PLT 186 186   BMET  Recent Labs  03/28/13 0538 03/29/13 0550  NA 137 135  K 4.1 4.0  CL 104 101  CO2 24 24  GLUCOSE 121* 155*  BUN 11 11  CREATININE 1.39* 1.41*  CALCIUM 7.9* 8.1*   INR  Date Value Range Status  03/27/2013 1.00  0.00 - 1.49 Final     Exam Right hip and knee dressings dry. Thigh soft. NVI Rle        Plan Continue mobilization and care per Trauma. Anticipate discharge over next day or so From our standpoint, he should continue ASA a day for DVT as well as continuing to increase activity levels   Nandini Bogdanski for Dr.Kevin Supple 03/29/2013, 1:25 PM
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
## 2013-03-30 ENCOUNTER — Encounter (HOSPITAL_COMMUNITY): Payer: Self-pay | Admitting: Orthopedic Surgery

## 2013-03-30 DIAGNOSIS — D62 Acute posthemorrhagic anemia: Secondary | ICD-10-CM | POA: Diagnosis not present

## 2013-03-30 LAB — CBC
MCH: 32.1 pg (ref 26.0–34.0)
MCV: 88.2 fL (ref 78.0–100.0)
Platelets: 182 10*3/uL (ref 150–400)
RBC: 3.74 MIL/uL — ABNORMAL LOW (ref 4.22–5.81)
RDW: 13.3 % (ref 11.5–15.5)
WBC: 9.2 10*3/uL (ref 4.0–10.5)

## 2013-03-30 LAB — BASIC METABOLIC PANEL
BUN: 12 mg/dL (ref 6–23)
Calcium: 8.4 mg/dL (ref 8.4–10.5)
Chloride: 100 mEq/L (ref 96–112)
Creatinine, Ser: 1.49 mg/dL — ABNORMAL HIGH (ref 0.50–1.35)
GFR calc Af Amer: 69 mL/min — ABNORMAL LOW (ref >90)
GFR calc non Af Amer: 60 mL/min — ABNORMAL LOW (ref >90)

## 2013-03-30 MED ORDER — METHOCARBAMOL 500 MG PO TABS: 500.0000 mg | ORAL_TABLET | Freq: Four times a day (QID) | ORAL | Status: DC | PRN

## 2013-03-30 MED ORDER — OXYCODONE-ACETAMINOPHEN 5-325 MG PO TABS: 1.0000 | ORAL_TABLET | ORAL | Status: DC | PRN

## 2013-03-30 MED ORDER — MAGNESIUM CITRATE PO SOLN: 1.0000 | Freq: Two times a day (BID) | ORAL | Status: DC | PRN

## 2013-03-30 MED ORDER — POLYETHYLENE GLYCOL 3350 17 G PO PACK: 17.0000 g | PACK | Freq: Every day | ORAL | Status: DC

## 2013-03-30 MED ORDER — DSS 100 MG PO CAPS: 200.0000 mg | ORAL_CAPSULE | Freq: Two times a day (BID) | ORAL | Status: DC

## 2013-03-30 MED ORDER — BISACODYL 5 MG PO TBEC: 10.0000 mg | DELAYED_RELEASE_TABLET | Freq: Every day | ORAL | Status: DC
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
## 2013-03-30 NOTE — Discharge Summary (Signed)
Wilmon Arms. Corliss Skains, MD, [facility] Surgery  General/ Trauma Surgery  03/30/2013 9:42 AM

## 2013-03-30 NOTE — Progress Notes (Signed)
PT Cancellation Note  Patient Details Name: Hector Wallace MRN: [id number] DOB: Dec 04, 1980   Cancelled Treatment:     Pt deferring therapy at this time.  Pt with c/o not feeling well.  Pt does have 9 steps to enter home.  Needs to practice before d/cing home later today.  RN made aware.     Lara Mulch 03/30/2013, 10:31 AM

## 2013-03-30 NOTE — Progress Notes (Signed)
Hussam Muniz K. Samson Ralph, MD, FACS [facility]  General/ Trauma Surgery  03/30/2013 9:42 AM  
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
## 2013-04-10 ENCOUNTER — Telehealth (HOSPITAL_COMMUNITY): Payer: Self-pay | Admitting: Emergency Medicine

## 2013-04-10 MED ORDER — OXYCODONE-ACETAMINOPHEN 5-325 MG PO TABS: 1.0000 | ORAL_TABLET | ORAL | Status: DC | PRN

## 2013-04-10 NOTE — Telephone Encounter (Signed)
Refilled rx for Percocet but told mother that he would need to get future refills from neurosurgery or orthopedic surgery.

## 2013-05-03 ENCOUNTER — Ambulatory Visit: Payer: Self-pay | Admitting: Physician Assistant

## 2013-06-07 ENCOUNTER — Ambulatory Visit: Payer: Self-pay | Admitting: Physician Assistant

## 2013-06-12 ENCOUNTER — Ambulatory Visit: Payer: Self-pay | Attending: Orthopedic Surgery

## 2013-06-12 DIAGNOSIS — M79609 Pain in unspecified limb: Secondary | ICD-10-CM | POA: Insufficient documentation

## 2013-06-12 DIAGNOSIS — R262 Difficulty in walking, not elsewhere classified: Secondary | ICD-10-CM | POA: Insufficient documentation

## 2013-06-12 DIAGNOSIS — M2560 Stiffness of unspecified joint, not elsewhere classified: Secondary | ICD-10-CM | POA: Insufficient documentation

## 2013-06-12 DIAGNOSIS — M25569 Pain in unspecified knee: Secondary | ICD-10-CM | POA: Insufficient documentation

## 2013-06-12 DIAGNOSIS — IMO0001 Reserved for inherently not codable concepts without codable children: Secondary | ICD-10-CM | POA: Insufficient documentation

## 2013-06-18 ENCOUNTER — Ambulatory Visit: Payer: Self-pay

## 2013-06-19 ENCOUNTER — Ambulatory Visit: Payer: Commercial Indemnity

## 2013-06-25 ENCOUNTER — Ambulatory Visit: Payer: Self-pay | Attending: Orthopedic Surgery

## 2013-06-25 DIAGNOSIS — IMO0001 Reserved for inherently not codable concepts without codable children: Secondary | ICD-10-CM | POA: Insufficient documentation

## 2013-06-25 DIAGNOSIS — M25569 Pain in unspecified knee: Secondary | ICD-10-CM | POA: Insufficient documentation

## 2013-06-25 DIAGNOSIS — M2560 Stiffness of unspecified joint, not elsewhere classified: Secondary | ICD-10-CM | POA: Insufficient documentation

## 2013-06-25 DIAGNOSIS — M79609 Pain in unspecified limb: Secondary | ICD-10-CM | POA: Insufficient documentation

## 2013-06-25 DIAGNOSIS — R262 Difficulty in walking, not elsewhere classified: Secondary | ICD-10-CM | POA: Insufficient documentation

## 2013-06-28 ENCOUNTER — Ambulatory Visit: Payer: Self-pay

## 2013-06-29 ENCOUNTER — Ambulatory Visit: Payer: Self-pay

## 2013-07-02 ENCOUNTER — Ambulatory Visit: Payer: Self-pay

## 2013-07-04 ENCOUNTER — Ambulatory Visit: Payer: Self-pay | Admitting: Physical Therapy

## 2013-07-09 ENCOUNTER — Ambulatory Visit: Payer: Self-pay

## 2013-07-11 ENCOUNTER — Ambulatory Visit: Payer: Self-pay

## 2013-07-16 ENCOUNTER — Ambulatory Visit: Payer: Self-pay

## 2013-07-19 ENCOUNTER — Ambulatory Visit: Payer: Commercial Indemnity

## 2013-07-23 ENCOUNTER — Ambulatory Visit: Payer: Self-pay | Attending: Orthopedic Surgery

## 2013-07-23 DIAGNOSIS — IMO0001 Reserved for inherently not codable concepts without codable children: Secondary | ICD-10-CM | POA: Insufficient documentation

## 2013-07-23 DIAGNOSIS — M79609 Pain in unspecified limb: Secondary | ICD-10-CM | POA: Insufficient documentation

## 2013-07-23 DIAGNOSIS — M25569 Pain in unspecified knee: Secondary | ICD-10-CM | POA: Insufficient documentation

## 2013-07-23 DIAGNOSIS — M2560 Stiffness of unspecified joint, not elsewhere classified: Secondary | ICD-10-CM | POA: Insufficient documentation

## 2013-07-23 DIAGNOSIS — R262 Difficulty in walking, not elsewhere classified: Secondary | ICD-10-CM | POA: Insufficient documentation

## 2014-09-30 IMAGING — CT CT ANGIO NECK
1 of 6 series · 11 of 33 positions shown · IV contrast (omnipaque)
Comparison: 03/27/2013 cervical spine CT. Next 2

CLINICAL DATA: Cervical spine fracture. Evaluate for vertebral or
carotid injury.

EXAM:
CT ANGIOGRAPHY NECK
TECHNIQUE: Multidetector CT imaging of the neck was performed using the
standard protocol during bolus administration of intravenous
contrast. Multiplanar CT image reconstructions including MIPs were
obtained to evaluate the vascular anatomy. Carotid stenosis
measurements (when applicable) are obtained utilizing NASCET
criteria, using the distal internal carotid diameter as the
denominator.
CONTRAST:  50mL OMNIPAQUE IOHEXOL 350 MG/ML SOLN

[Series 4: carotid · axial · 0.45mm/px · z∈[+111,+331]mm · 11 of 132 slices shown]
[im 11/132  soft-tissue]
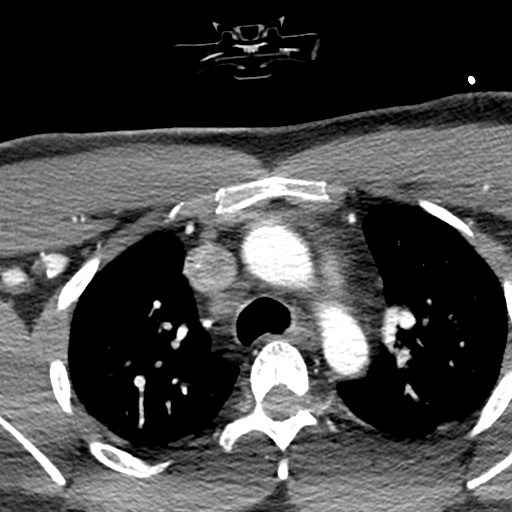
[im 22/132  bone]
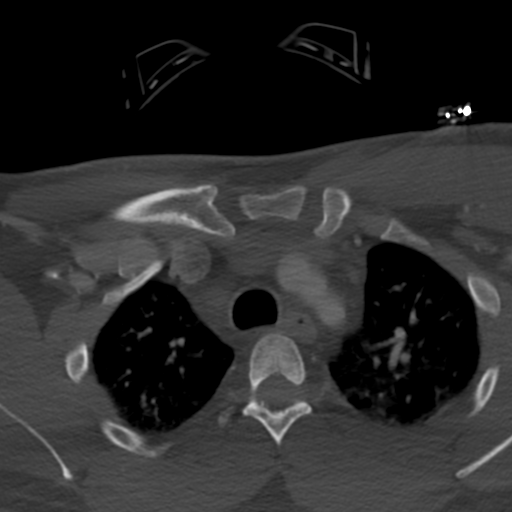
[im 33/132  soft-tissue]
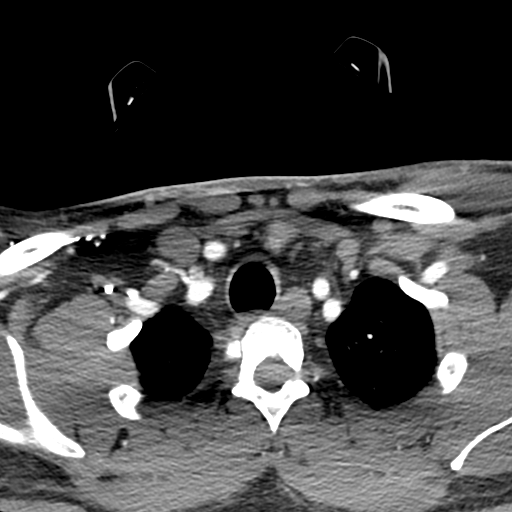
[im 44/132  bone]
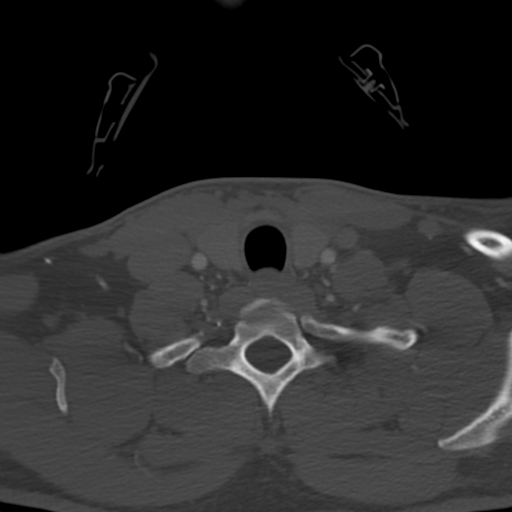
[im 55/132  soft-tissue]
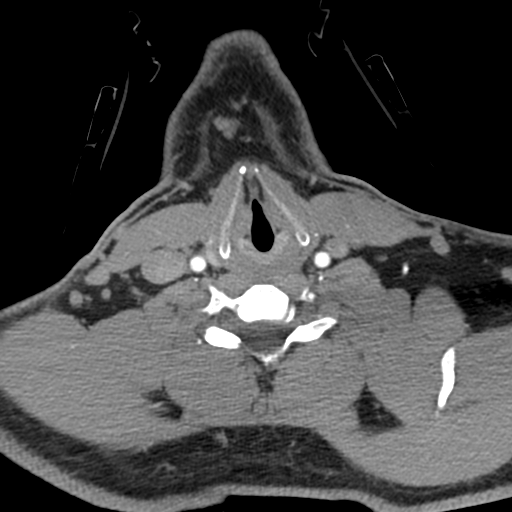
[im 66/132  bone]
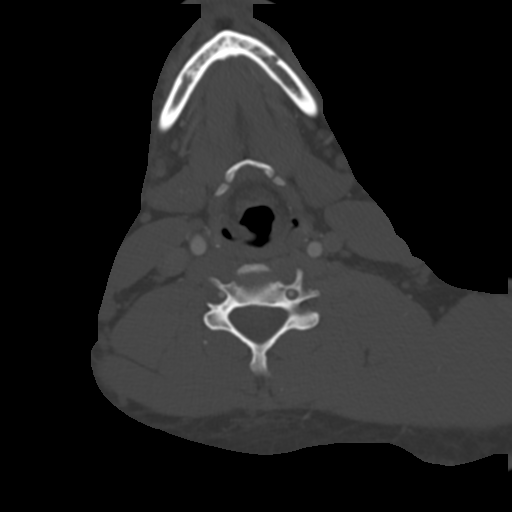
[im 77/132  soft-tissue]
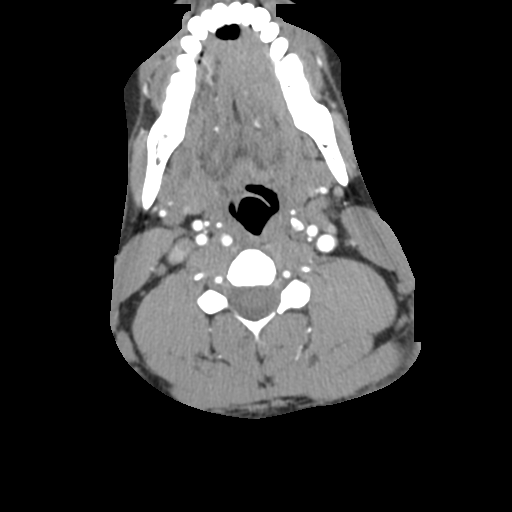
[im 88/132  bone]
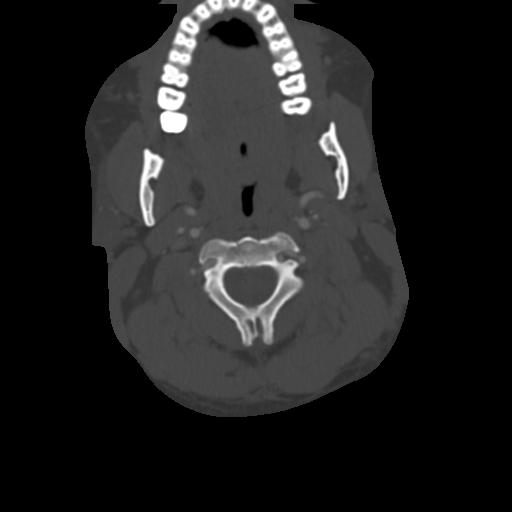
[im 99/132  soft-tissue]
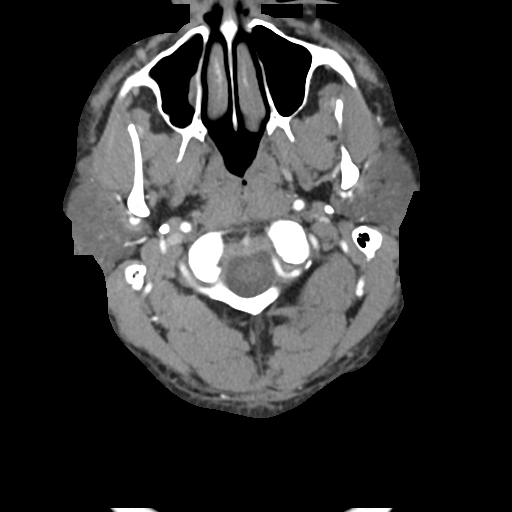
[im 110/132  bone]
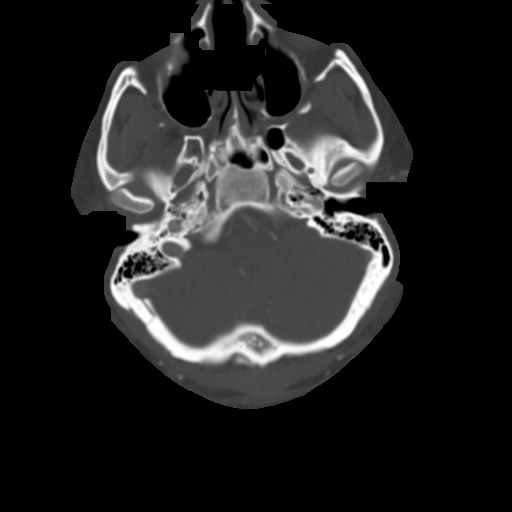
[im 121/132  soft-tissue]
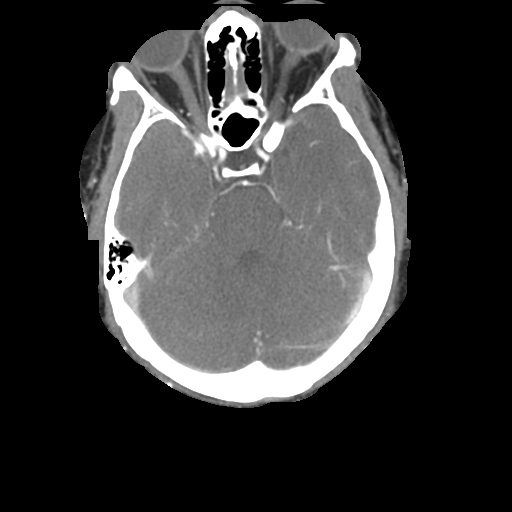

[11 of 33 positions shown; findings below may reference images not displayed]

FINDINGS: Fracture of the left C4 lamina and facet as best visualized on
recent cervical spine CT. There is minimal anterior slip of C4. The
left vertebral artery appears minimally narrowed at the C4 level
which may reflect result of the minimal anterior shift of C4 rather
than vascular injury as there is no irregularity of the left
vertebral artery at this level.

Right vertebral artery and both carotid arteries appear intact.

No lung apical pneumothorax.

No worrisome primary neck mass or adenopathy.

Review of the MIP images confirms the above findings.
IMPRESSION: Fracture of the left C4 lamina and facet as best visualized on
recent cervical spine CT. There is minimal anterior slip of C4.

The left vertebral artery appears minimally narrowed at the C4 level
(series 52792 image 50) which may reflect result of the minimal
anterior shift of C4 rather than vascular injury as there is no
irregularity of the left vertebral artery at this level.

Right vertebral artery and both carotid arteries appear intact.

## 2014-10-28 ENCOUNTER — Emergency Department (INDEPENDENT_AMBULATORY_CARE_PROVIDER_SITE_OTHER): Payer: Self-pay

## 2014-10-28 ENCOUNTER — Encounter (HOSPITAL_COMMUNITY): Payer: Self-pay | Admitting: *Deleted

## 2014-10-28 ENCOUNTER — Emergency Department (INDEPENDENT_AMBULATORY_CARE_PROVIDER_SITE_OTHER)
Admission: EM | Admit: 2014-10-28 | Discharge: 2014-10-28 | Disposition: A | Discharge status: Home / Self Care | Payer: Self-pay | Source: Home / Self Care | Attending: Family Medicine | Admitting: Family Medicine

## 2014-10-28 DIAGNOSIS — M20011 Mallet finger of right finger(s): Secondary | ICD-10-CM

## 2014-10-28 NOTE — ED Provider Notes (Signed)
CSN: [id number]     Arrival date & time 10/28/14  1502 History   First MD Initiated Contact with Patient 10/28/14 1607     Chief Complaint  Patient presents with  . Finger Injury   (Consider location/radiation/quality/duration/timing/severity/associated sxs/prior Treatment) Patient is a 34 y.o. male presenting with hand pain. The history is provided by the patient. No language interpreter was used.  Hand Pain This is a chronic problem. Episode onset: 5 weeks. The problem occurs constantly. The problem has not changed since onset.Nothing aggravates the symptoms. Nothing relieves the symptoms. He has tried nothing for the symptoms. The treatment provided no relief.  Pt injured finger 5 weeks ago.  Pt unable to straighten tip of finger since. No pain  History reviewed. No pertinent past medical history. Past Surgical History  Procedure Laterality Date  . Femur im nail Right 03/27/2013    Procedure: INTRAMEDULLARY (IM) NAIL FEMORAL;  Surgeon: Senaida LangeKevin M Supple, MD;  Location: [facility] OR;  Service: Orthopedics;  Laterality: Right;   History reviewed. No pertinent family history. History  Substance Use Topics  . Smoking status: Current Every Day Smoker  . Smokeless tobacco: Not on file  . Alcohol Use: Yes    Review of Systems  Musculoskeletal: Negative for myalgias and joint swelling.  All other systems reviewed and are negative.   Allergies  Pork-derived products  Home Medications   Prior to Admission medications   Medication Sig Start Date End Date Taking? Authorizing Provider  aspirin EC 325 MG EC tablet Take 1 tablet (325 mg total) by mouth daily with breakfast. 03/29/13   Ralene Batheracy Shuford, PA-C  bisacodyl (DULCOLAX) 5 MG EC tablet Take 2 tablets (10 mg total) by mouth daily. 03/30/13   Freeman CaldronMichael J Jeffery, PA-C  docusate sodium 100 MG CAPS Take 200 mg by mouth 2 (two) times daily. 03/30/13   Freeman CaldronMichael J Jeffery, PA-C  magnesium citrate SOLN Take 296 mLs (1 Bottle total) by mouth 2 (two) times  daily as needed (For constipation). 03/30/13   Freeman CaldronMichael J Jeffery, PA-C  methocarbamol (ROBAXIN) 500 MG tablet Take 1-2 tablets (500-1,000 mg total) by mouth every 6 (six) hours as needed. 03/30/13   Freeman CaldronMichael J Jeffery, PA-C  oxyCODONE-acetaminophen (ROXICET) 5-325 MG per tablet Take 1-2 tablets by mouth every 4 (four) hours as needed for pain. 04/10/13   Freeman CaldronMichael J Jeffery, PA-C  polyethylene glycol [facility](MIRALAX / Ethelene HalGLYCOLAX) packet Take 17 g by mouth daily. 03/30/13   Freeman CaldronMichael J Jeffery, PA-C   BP 130/70 mmHg  Pulse 78  Temp(Src) 98.6 F (37 C) (Oral)  Resp 18  SpO2 100% Physical Exam  Constitutional: He is oriented to person, place, and time. He appears well-developed and well-nourished.  HENT:  Head: Normocephalic.  Eyes: EOM are normal.  Neck: Normal range of motion.  Pulmonary/Chest: Effort normal.  Abdominal: He exhibits no distension.  Musculoskeletal:  Deformity at dip of right fifth finger,   Pt unable to fully extend  nv and ns intact  Neurological: He is alert and oriented to person, place, and time.  Psychiatric: He has a normal mood and affect.  Nursing note and vitals reviewed.   ED Course  Procedures (including critical care time) Labs Review Labs Reviewed - No data to display  Imaging Review Dg Finger Little Right  10/28/2014   CLINICAL DATA:  Fall 5 weeks ago with trauma to the right hand  EXAM: RIGHT LITTLE FINGER 2+V  COMPARISON:  None.  FINDINGS: No fracture or dislocation is identified. There is  persistent flexion at the DIP joint of the right fifth digit. No radiopaque foreign body.  IMPRESSION: Persistent flexion of unknown chronicity at the right fifth digit DIP joint   Electronically Signed   By: Christiana PellantGretchen  Green M.D.   On: 10/28/2014 16:00     MDM   1. Mallet deformity of fifth finger of right hand    Splint Pt advised he needs to see Hand Surgeon for repair, AVS on mallet finger    Elson AreasLeslie K Demani Mcbrien, PA-C 10/28/14 1705

## 2014-10-28 NOTE — ED Notes (Signed)
Pt  Reports  About  5  Weeks  Ago  He  Snagged  His  r  Pinky  While  Carrying a  Engineer, manufacturing systemsuitcase  And  He  Felled    He  Has  decresed  ROM to    Small  Pinky    unnable  To  Extend  It

## 2014-10-28 NOTE — Discharge Instructions (Signed)
Mallet Finger °A mallet, or jammed, finger occurs when the end of a straightened finger or thumb receives a blow (often from a ball). This causes a disruption (tearing) of the extensor tendon (cord like structure which attaches muscle to bone) that straightens the end of your finger. The last joint in your finger will droop and you cannot extend it. Sometimes this is associated with a small fracture (break in bone) of the base of the end bone (phalanx) in your finger. It usually takes 4 to 5 weeks to heal. °HOME CARE INSTRUCTIONS  °· Apply ice to the sore finger for 15-20 minutes, 03-04 times per day for 2 days. Put the ice in a plastic bag and place a towel between the bag of ice and your skin. °· If you have a finger splint, wear your splint as directed. °¨ You may remove the splint to wash your finger or as directed. °¨ If your splint is off, do not try to bend the tip of your finger. °¨ Put your splint back on as soon as possible. If your finger is numb or tingling, the splint is probably too tight. You can loosen it so it is comfortable. °· Move the part of your injured finger that is not covered by the splint several times a day. °· Take medications as directed by your caregiver. Only take over-the-counter or prescription medicines for pain, discomfort, or fever as directed by your caregiver. °· IMPORTANT: follow up with your caregiver or keep or call for any appointments with specialists as directed. The failure to follow up could result in chronic pain and / or disability. °SEEK MEDICAL CARE IF:  °· You have increased pain or swelling. °· You notice coldness of your finger. °· After treatment you still cannot extend your finger. °SEEK IMMEDIATE MEDICAL CARE IF:  °Your finger is swollen and very red, white, blue, numb, cold, or tingling. °MAKE SURE YOU:  °· Understand these instructions. °· Will watch your condition. °· Will get help right away if you are not doing well or get worse. °Document Released:  06/04/2000 Document Revised: 10/22/2013 Document Reviewed: 01/19/2008 °[employer]® Patient Information ©2015 [employer]. This information is not intended to replace advice given to you by your health care provider. Make sure you discuss any questions you have with your health care provider. ° °

## 2017-09-26 ENCOUNTER — Emergency Department (HOSPITAL_COMMUNITY): Payer: BLUE CROSS/BLUE SHIELD

## 2017-09-26 ENCOUNTER — Inpatient Hospital Stay (HOSPITAL_COMMUNITY)
Admission: EM | Admit: 2017-09-26 | Discharge: 2017-09-28 | DRG: 343 | Disposition: A | Discharge status: Home / Self Care | Payer: BLUE CROSS/BLUE SHIELD | Attending: Surgery | Admitting: Surgery

## 2017-09-26 ENCOUNTER — Encounter (HOSPITAL_COMMUNITY): Payer: Self-pay | Admitting: Family Medicine

## 2017-09-26 DIAGNOSIS — Z713 Dietary counseling and surveillance: Secondary | ICD-10-CM | POA: Diagnosis not present

## 2017-09-26 DIAGNOSIS — N182 Chronic kidney disease, stage 2 (mild): Secondary | ICD-10-CM | POA: Diagnosis present

## 2017-09-26 DIAGNOSIS — Z6830 Body mass index (BMI) 30.0-30.9, adult: Secondary | ICD-10-CM

## 2017-09-26 DIAGNOSIS — E669 Obesity, unspecified: Secondary | ICD-10-CM

## 2017-09-26 DIAGNOSIS — F17290 Nicotine dependence, other tobacco product, uncomplicated: Secondary | ICD-10-CM | POA: Diagnosis present

## 2017-09-26 DIAGNOSIS — K37 Unspecified appendicitis: Secondary | ICD-10-CM

## 2017-09-26 DIAGNOSIS — R739 Hyperglycemia, unspecified: Secondary | ICD-10-CM | POA: Diagnosis present

## 2017-09-26 DIAGNOSIS — I10 Essential (primary) hypertension: Secondary | ICD-10-CM

## 2017-09-26 DIAGNOSIS — K3580 Unspecified acute appendicitis: Secondary | ICD-10-CM | POA: Diagnosis present

## 2017-09-26 DIAGNOSIS — K35891 Other acute appendicitis without perforation, with gangrene: Secondary | ICD-10-CM | POA: Diagnosis present

## 2017-09-26 DIAGNOSIS — Z833 Family history of diabetes mellitus: Secondary | ICD-10-CM | POA: Diagnosis not present

## 2017-09-26 DIAGNOSIS — R200 Anesthesia of skin: Secondary | ICD-10-CM

## 2017-09-26 DIAGNOSIS — R7309 Other abnormal glucose: Secondary | ICD-10-CM

## 2017-09-26 DIAGNOSIS — Z91018 Allergy to other foods: Secondary | ICD-10-CM | POA: Diagnosis not present

## 2017-09-26 DIAGNOSIS — Z79899 Other long term (current) drug therapy: Secondary | ICD-10-CM | POA: Diagnosis not present

## 2017-09-26 DIAGNOSIS — I129 Hypertensive chronic kidney disease with stage 1 through stage 4 chronic kidney disease, or unspecified chronic kidney disease: Secondary | ICD-10-CM | POA: Diagnosis present

## 2017-09-26 DIAGNOSIS — Z7982 Long term (current) use of aspirin: Secondary | ICD-10-CM | POA: Diagnosis not present

## 2017-09-26 HISTORY — DX: Unspecified fracture of right femur, initial encounter for closed fracture: S72.91XA

## 2017-09-26 HISTORY — DX: Unspecified displaced fracture of fifth cervical vertebra, initial encounter for closed fracture: S12.400A

## 2017-09-26 HISTORY — DX: Unspecified displaced fracture of fourth cervical vertebra, initial encounter for closed fracture: S12.300A

## 2017-09-26 LAB — CBC
HEMATOCRIT: 48 % (ref 39.0–52.0)
Hemoglobin: 17.2 g/dL — ABNORMAL HIGH (ref 13.0–17.0)
MCH: 31.9 pg (ref 26.0–34.0)
MCHC: 35.8 g/dL (ref 30.0–36.0)
MCV: 89.1 fL (ref 78.0–100.0)
PLATELETS: 253 10*3/uL (ref 150–400)
RBC: 5.39 MIL/uL (ref 4.22–5.81)
RDW: 13.7 % (ref 11.5–15.5)
WBC: 10.4 10*3/uL (ref 4.0–10.5)

## 2017-09-26 LAB — COMPREHENSIVE METABOLIC PANEL
ALBUMIN: 4.1 g/dL (ref 3.5–5.0)
ALT: 21 U/L (ref 17–63)
AST: 25 U/L (ref 15–41)
Alkaline Phosphatase: 48 U/L (ref 38–126)
Anion gap: 14 (ref 5–15)
BUN: 13 mg/dL (ref 6–20)
CHLORIDE: 104 mmol/L (ref 101–111)
CO2: 20 mmol/L — AB (ref 22–32)
CREATININE: 1.63 mg/dL — AB (ref 0.61–1.24)
Calcium: 9.5 mg/dL (ref 8.9–10.3)
GFR calc Af Amer: >60 mL/min (ref >60)
GFR calc non Af Amer: 52 mL/min — ABNORMAL LOW (ref >60)
GLUCOSE: 157 mg/dL — AB (ref 65–99)
POTASSIUM: 3.9 mmol/L (ref 3.5–5.1)
SODIUM: 138 mmol/L (ref 135–145)
Total Bilirubin: 1 mg/dL (ref 0.3–1.2)
Total Protein: 7.8 g/dL (ref 6.5–8.1)

## 2017-09-26 LAB — URINALYSIS, ROUTINE W REFLEX MICROSCOPIC
BACTERIA UA: NONE SEEN
Bilirubin Urine: NEGATIVE
Glucose, UA: NEGATIVE mg/dL
HGB URINE DIPSTICK: NEGATIVE
Ketones, ur: 5 mg/dL — AB
Leukocytes, UA: NEGATIVE
Nitrite: NEGATIVE
PROTEIN: 100 mg/dL — AB
SPECIFIC GRAVITY, URINE: 1.014 (ref 1.005–1.030)
pH: 9 — ABNORMAL HIGH (ref 5.0–8.0)

## 2017-09-26 LAB — LIPASE, BLOOD: LIPASE: 27 U/L (ref 11–51)

## 2017-09-26 MED ORDER — ALUM & MAG HYDROXIDE-SIMETH 200-200-20 MG/5ML PO SUSP: 30.0000 mL | Freq: Four times a day (QID) | ORAL | Status: DC | PRN

## 2017-09-26 MED ORDER — DIPHENHYDRAMINE HCL 12.5 MG/5ML PO ELIX: 12.5000 mg | ORAL_SOLUTION | Freq: Four times a day (QID) | ORAL | Status: DC | PRN

## 2017-09-26 MED ORDER — METHOCARBAMOL 500 MG PO TABS
500.0000 mg | ORAL_TABLET | Freq: Four times a day (QID) | ORAL | Status: DC | PRN
  Administered 2017-09-27: 500 mg via ORAL
  Filled 2017-09-26: qty 1

## 2017-09-26 MED ORDER — OXYCODONE-ACETAMINOPHEN 5-325 MG PO TABS
1.0000 | ORAL_TABLET | Freq: Once | ORAL | Status: AC
  Administered 2017-09-26: 1 via ORAL
  Filled 2017-09-26: qty 1

## 2017-09-26 MED ORDER — LACTATED RINGERS IV BOLUS: 1000.0000 mL | Freq: Three times a day (TID) | INTRAVENOUS | Status: DC | PRN

## 2017-09-26 MED ORDER — SIMETHICONE 80 MG PO CHEW: 80.0000 mg | CHEWABLE_TABLET | Freq: Four times a day (QID) | ORAL | Status: DC | PRN

## 2017-09-26 MED ORDER — HYDROMORPHONE HCL 1 MG/ML IJ SOLN
0.5000 mg | Freq: Once | INTRAMUSCULAR | Status: AC
Start: 2017-09-26 — End: 2017-09-26
  Administered 2017-09-26: 0.5 mg via INTRAVENOUS
  Filled 2017-09-26: qty 1

## 2017-09-26 MED ORDER — HYDROMORPHONE HCL 1 MG/ML IJ SOLN
0.5000 mg | Freq: Once | INTRAMUSCULAR | Status: AC
  Administered 2017-09-26: 0.5 mg via INTRAVENOUS
  Filled 2017-09-26: qty 1

## 2017-09-26 MED ORDER — SODIUM CHLORIDE 0.9 % IV BOLUS
1000.0000 mL | Freq: Once | INTRAVENOUS | Status: AC
  Administered 2017-09-26: 1000 mL via INTRAVENOUS

## 2017-09-26 MED ORDER — ACETAMINOPHEN 500 MG PO TABS
1000.0000 mg | ORAL_TABLET | ORAL | Status: AC
  Administered 2017-09-27: 1000 mg via ORAL
  Filled 2017-09-26: qty 2

## 2017-09-26 MED ORDER — HYDROMORPHONE HCL 1 MG/ML IJ SOLN
1.0000 mg | INTRAMUSCULAR | Status: DC | PRN
Start: 2017-09-26 — End: 2017-09-27
  Administered 2017-09-26: 1 mg via INTRAVENOUS
  Filled 2017-09-26: qty 2

## 2017-09-26 MED ORDER — LIP MEDEX EX OINT
1.0000 "application " | TOPICAL_OINTMENT | Freq: Two times a day (BID) | CUTANEOUS | Status: DC
  Administered 2017-09-26 – 2017-09-27 (×2): 1 via TOPICAL
  Filled 2017-09-26: qty 7

## 2017-09-26 MED ORDER — ASPIRIN EC 325 MG PO TBEC
325.0000 mg | DELAYED_RELEASE_TABLET | Freq: Every day | ORAL | Status: DC
  Administered 2017-09-28: 325 mg via ORAL
  Filled 2017-09-26: qty 1

## 2017-09-26 MED ORDER — IOPAMIDOL (ISOVUE-300) INJECTION 61%
INTRAVENOUS | Status: AC
  Filled 2017-09-26: qty 100

## 2017-09-26 MED ORDER — ENOXAPARIN SODIUM 40 MG/0.4ML ~~LOC~~ SOLN: 40.0000 mg | SUBCUTANEOUS | Status: DC

## 2017-09-26 MED ORDER — ONDANSETRON HCL 4 MG/2ML IJ SOLN
4.0000 mg | Freq: Once | INTRAMUSCULAR | Status: AC
  Administered 2017-09-26: 4 mg via INTRAVENOUS
  Filled 2017-09-26: qty 2

## 2017-09-26 MED ORDER — ACETAMINOPHEN 325 MG PO TABS
650.0000 mg | ORAL_TABLET | Freq: Four times a day (QID) | ORAL | Status: DC | PRN
  Administered 2017-09-27 – 2017-09-28 (×2): 650 mg via ORAL
  Filled 2017-09-26 (×2): qty 2

## 2017-09-26 MED ORDER — IOPAMIDOL (ISOVUE-300) INJECTION 61%
100.0000 mL | Freq: Once | INTRAVENOUS | Status: AC | PRN
  Administered 2017-09-26: 100 mL via INTRAVENOUS

## 2017-09-26 MED ORDER — MAGIC MOUTHWASH
15.0000 mL | Freq: Four times a day (QID) | ORAL | Status: DC | PRN
  Filled 2017-09-26: qty 15

## 2017-09-26 MED ORDER — PROCHLORPERAZINE EDISYLATE 5 MG/ML IJ SOLN: 5.0000 mg | INTRAMUSCULAR | Status: DC | PRN

## 2017-09-26 MED ORDER — LACTATED RINGERS IV BOLUS
1000.0000 mL | Freq: Once | INTRAVENOUS | Status: AC
  Administered 2017-09-26: 1000 mL via INTRAVENOUS

## 2017-09-26 MED ORDER — CHLORHEXIDINE GLUCONATE CLOTH 2 % EX PADS
6.0000 | MEDICATED_PAD | Freq: Once | CUTANEOUS | Status: AC
  Administered 2017-09-26: 6 via TOPICAL

## 2017-09-26 MED ORDER — SODIUM CHLORIDE 0.9 % IV SOLN
2.0000 g | INTRAVENOUS | Status: DC
  Filled 2017-09-26: qty 2

## 2017-09-26 MED ORDER — CEFTRIAXONE SODIUM 2 G IJ SOLR
2.0000 g | Freq: Every day | INTRAMUSCULAR | Status: DC
  Administered 2017-09-27: 2 g via INTRAVENOUS
  Filled 2017-09-26: qty 2

## 2017-09-26 MED ORDER — HYDROCORTISONE 2.5 % RE CREA
1.0000 "application " | TOPICAL_CREAM | Freq: Four times a day (QID) | RECTAL | Status: DC | PRN
  Filled 2017-09-26: qty 28.35

## 2017-09-26 MED ORDER — HYDROCORTISONE 1 % EX CREA
1.0000 "application " | TOPICAL_CREAM | Freq: Three times a day (TID) | CUTANEOUS | Status: DC | PRN
  Filled 2017-09-26: qty 28

## 2017-09-26 MED ORDER — ONDANSETRON HCL 4 MG/2ML IJ SOLN: 4.0000 mg | Freq: Four times a day (QID) | INTRAMUSCULAR | Status: DC | PRN

## 2017-09-26 MED ORDER — FONDAPARINUX SODIUM 2.5 MG/0.5ML ~~LOC~~ SOLN
2.5000 mg | Freq: Every day | SUBCUTANEOUS | Status: DC
  Administered 2017-09-26 – 2017-09-27 (×2): 2.5 mg via SUBCUTANEOUS
  Filled 2017-09-26 (×2): qty 0.5

## 2017-09-26 MED ORDER — SIMETHICONE 80 MG PO CHEW
40.0000 mg | CHEWABLE_TABLET | Freq: Four times a day (QID) | ORAL | Status: DC | PRN
  Filled 2017-09-26: qty 1

## 2017-09-26 MED ORDER — ONDANSETRON 4 MG PO TBDP
4.0000 mg | ORAL_TABLET | Freq: Once | ORAL | Status: AC
  Administered 2017-09-26: 4 mg via ORAL
  Filled 2017-09-26: qty 1

## 2017-09-26 MED ORDER — ACETAMINOPHEN 650 MG RE SUPP: 650.0000 mg | Freq: Four times a day (QID) | RECTAL | Status: DC | PRN

## 2017-09-26 MED ORDER — METOPROLOL TARTRATE 5 MG/5ML IV SOLN: 5.0000 mg | Freq: Four times a day (QID) | INTRAVENOUS | Status: DC | PRN

## 2017-09-26 MED ORDER — METRONIDAZOLE IN NACL 5-0.79 MG/ML-% IV SOLN
500.0000 mg | Freq: Three times a day (TID) | INTRAVENOUS | Status: DC
  Administered 2017-09-26 – 2017-09-27 (×2): 500 mg via INTRAVENOUS
  Filled 2017-09-26 (×2): qty 100

## 2017-09-26 MED ORDER — METHOCARBAMOL 1000 MG/10ML IJ SOLN
1000.0000 mg | Freq: Four times a day (QID) | INTRAVENOUS | Status: DC | PRN
  Filled 2017-09-26: qty 10

## 2017-09-26 MED ORDER — PHENOL 1.4 % MT LIQD
1.0000 | OROMUCOSAL | Status: DC | PRN
  Filled 2017-09-26: qty 177

## 2017-09-26 MED ORDER — BISMUTH SUBSALICYLATE 262 MG/15ML PO SUSP
30.0000 mL | Freq: Four times a day (QID) | ORAL | Status: DC | PRN
  Filled 2017-09-26: qty 236

## 2017-09-26 MED ORDER — MENTHOL 3 MG MT LOZG
1.0000 | LOZENGE | OROMUCOSAL | Status: DC | PRN
  Filled 2017-09-26: qty 9

## 2017-09-26 MED ORDER — GABAPENTIN 300 MG PO CAPS
300.0000 mg | ORAL_CAPSULE | ORAL | Status: AC
  Administered 2017-09-27: 300 mg via ORAL
  Filled 2017-09-26: qty 1

## 2017-09-26 MED ORDER — GUAIFENESIN-DM 100-10 MG/5ML PO SYRP: 10.0000 mL | ORAL_SOLUTION | ORAL | Status: DC | PRN

## 2017-09-26 MED ORDER — DIPHENHYDRAMINE HCL 50 MG/ML IJ SOLN: 12.5000 mg | Freq: Four times a day (QID) | INTRAMUSCULAR | Status: DC | PRN

## 2017-09-26 MED ORDER — LACTATED RINGERS IV SOLN
INTRAVENOUS | Status: DC
  Administered 2017-09-26: 23:00:00 via INTRAVENOUS
  Administered 2017-09-27: 1000 mL via INTRAVENOUS

## 2017-09-26 MED ORDER — METOCLOPRAMIDE HCL 5 MG/ML IJ SOLN
10.0000 mg | Freq: Four times a day (QID) | INTRAMUSCULAR | Status: DC | PRN
  Administered 2017-09-26: 10 mg via INTRAVENOUS
  Filled 2017-09-26: qty 2

## 2017-09-26 MED ORDER — HYDRALAZINE HCL 20 MG/ML IJ SOLN: 5.0000 mg | INTRAMUSCULAR | Status: DC | PRN

## 2017-09-26 MED ORDER — CELECOXIB 200 MG PO CAPS
200.0000 mg | ORAL_CAPSULE | ORAL | Status: AC
  Administered 2017-09-27: 200 mg via ORAL
  Filled 2017-09-26 (×2): qty 1

## 2017-09-26 MED ORDER — KETOROLAC TROMETHAMINE 15 MG/ML IJ SOLN: 15.0000 mg | Freq: Four times a day (QID) | INTRAMUSCULAR | Status: DC | PRN

## 2017-09-26 MED ORDER — ONDANSETRON HCL 40 MG/20ML IJ SOLN
8.0000 mg | Freq: Four times a day (QID) | INTRAMUSCULAR | Status: DC | PRN
  Filled 2017-09-26: qty 4

## 2017-09-26 NOTE — ED Triage Notes (Signed)
Offered patient a ZOFRAN ODT during triage and he refused.

## 2017-09-26 NOTE — ED Triage Notes (Signed)
Patient is complaining of generalized abd pain that started suddenly this morning. Patient is complaining of nausea, vomiting 7-8 times, and diarrhea 7-8 times. Patient denies fever but reports he has been sweating. Patient has took [employer]pto Bismal and gas-x today with no relief.

## 2017-09-26 NOTE — ED Provider Notes (Signed)
Patient placed in Quick Look pathway, seen and evaluated   Chief Complaint: abd pain  HPI:   1 day of Abd pain with N/V/D  ROS: no fever (one)  Physical Exam:   Gen: No distress, but in obvious discomfort  Neuro: Awake and Alert  Skin: Warm    Focused Exam: diffuse abd TTP, worse in RLQ with guarding and rebound.  Will obtain CT.   Initiation of care has begun. The patient has been counseled on the process, plan, and necessity for staying for the completion/evaluation, and the remainder of the medical screening examination    Cardama, Amadeo GarnetPedro Eduardo, MD 09/27/17 1758
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
## 2017-09-26 NOTE — ED Notes (Signed)
Report called to TOM.
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
## 2017-09-27 ENCOUNTER — Encounter (HOSPITAL_COMMUNITY): Admission: EM | Disposition: A | Discharge status: Home / Self Care | Payer: Self-pay | Source: Home / Self Care

## 2017-09-27 ENCOUNTER — Other Ambulatory Visit: Payer: Self-pay

## 2017-09-27 ENCOUNTER — Inpatient Hospital Stay (HOSPITAL_COMMUNITY): Payer: BLUE CROSS/BLUE SHIELD | Admitting: Registered Nurse

## 2017-09-27 ENCOUNTER — Inpatient Hospital Stay (HOSPITAL_COMMUNITY): Payer: BLUE CROSS/BLUE SHIELD

## 2017-09-27 ENCOUNTER — Encounter (HOSPITAL_COMMUNITY): Payer: Self-pay | Admitting: General Surgery

## 2017-09-27 DIAGNOSIS — E669 Obesity, unspecified: Secondary | ICD-10-CM

## 2017-09-27 DIAGNOSIS — N182 Chronic kidney disease, stage 2 (mild): Secondary | ICD-10-CM

## 2017-09-27 DIAGNOSIS — I10 Essential (primary) hypertension: Secondary | ICD-10-CM

## 2017-09-27 HISTORY — PX: LAPAROSCOPIC APPENDECTOMY: SHX408

## 2017-09-27 LAB — BASIC METABOLIC PANEL
ANION GAP: 6 (ref 5–15)
BUN: 15 mg/dL (ref 6–20)
CALCIUM: 8.6 mg/dL — AB (ref 8.9–10.3)
CO2: 26 mmol/L (ref 22–32)
Chloride: 107 mmol/L (ref 101–111)
Creatinine, Ser: 1.72 mg/dL — ABNORMAL HIGH (ref 0.61–1.24)
GFR, EST AFRICAN AMERICAN: 56 mL/min — AB (ref >60)
GFR, EST NON AFRICAN AMERICAN: 49 mL/min — AB (ref >60)
GLUCOSE: 153 mg/dL — AB (ref 65–99)
Potassium: 4.3 mmol/L (ref 3.5–5.1)
Sodium: 139 mmol/L (ref 135–145)

## 2017-09-27 LAB — SURGICAL PCR SCREEN
MRSA, PCR: NEGATIVE
STAPHYLOCOCCUS AUREUS: NEGATIVE

## 2017-09-27 LAB — HIV ANTIBODY (ROUTINE TESTING W REFLEX): HIV SCREEN 4TH GENERATION: NONREACTIVE

## 2017-09-27 LAB — HEMOGLOBIN A1C
Hgb A1c MFr Bld: 5.9 % — ABNORMAL HIGH (ref 4.8–5.6)
Mean Plasma Glucose: 122.63 mg/dL

## 2017-09-27 SURGERY — APPENDECTOMY, LAPAROSCOPIC
Anesthesia: General | Site: Abdomen

## 2017-09-27 MED ORDER — MEPERIDINE HCL 50 MG/ML IJ SOLN: 6.2500 mg | INTRAMUSCULAR | Status: DC | PRN

## 2017-09-27 MED ORDER — PROPOFOL 10 MG/ML IV BOLUS
INTRAVENOUS | Status: AC
  Filled 2017-09-27: qty 20

## 2017-09-27 MED ORDER — ROCURONIUM BROMIDE 10 MG/ML (PF) SYRINGE
PREFILLED_SYRINGE | INTRAVENOUS | Status: DC | PRN
  Administered 2017-09-27: 50 mg via INTRAVENOUS
  Administered 2017-09-27: 10 mg via INTRAVENOUS

## 2017-09-27 MED ORDER — BUPIVACAINE-EPINEPHRINE (PF) 0.5% -1:200000 IJ SOLN
INTRAMUSCULAR | Status: DC | PRN
  Administered 2017-09-27: 20 mL

## 2017-09-27 MED ORDER — PHENYLEPHRINE 40 MCG/ML (10ML) SYRINGE FOR IV PUSH (FOR BLOOD PRESSURE SUPPORT)
PREFILLED_SYRINGE | INTRAVENOUS | Status: DC | PRN
  Administered 2017-09-27: 40 ug via INTRAVENOUS

## 2017-09-27 MED ORDER — ROCURONIUM BROMIDE 10 MG/ML (PF) SYRINGE
PREFILLED_SYRINGE | INTRAVENOUS | Status: AC
  Filled 2017-09-27: qty 5

## 2017-09-27 MED ORDER — CEFOTETAN DISODIUM-DEXTROSE 2-2.08 GM-%(50ML) IV SOLR
INTRAVENOUS | Status: AC
  Filled 2017-09-27: qty 50

## 2017-09-27 MED ORDER — DEXAMETHASONE SODIUM PHOSPHATE 10 MG/ML IJ SOLN
INTRAMUSCULAR | Status: DC | PRN
  Administered 2017-09-27: 10 mg via INTRAVENOUS

## 2017-09-27 MED ORDER — RINGERS IRRIGATION IR SOLN
Status: DC | PRN
  Administered 2017-09-27: 1000 mL

## 2017-09-27 MED ORDER — HYDROMORPHONE HCL 1 MG/ML IJ SOLN
0.2500 mg | INTRAMUSCULAR | Status: DC | PRN
  Administered 2017-09-27: 0.5 mg via INTRAVENOUS

## 2017-09-27 MED ORDER — LACTATED RINGERS IV SOLN
INTRAVENOUS | Status: DC
  Administered 2017-09-27: 09:00:00 via INTRAVENOUS

## 2017-09-27 MED ORDER — BUPIVACAINE-EPINEPHRINE (PF) 0.5% -1:200000 IJ SOLN
INTRAMUSCULAR | Status: AC
  Filled 2017-09-27: qty 30

## 2017-09-27 MED ORDER — SODIUM CHLORIDE 0.9 % IV SOLN
INTRAVENOUS | Status: DC
  Administered 2017-09-27: 15:00:00 via INTRAVENOUS

## 2017-09-27 MED ORDER — BUPIVACAINE LIPOSOME 1.3 % IJ SUSP
20.0000 mL | Freq: Once | INTRAMUSCULAR | Status: DC
  Filled 2017-09-27: qty 20

## 2017-09-27 MED ORDER — MIDAZOLAM HCL 2 MG/2ML IJ SOLN
INTRAMUSCULAR | Status: AC
  Filled 2017-09-27: qty 2

## 2017-09-27 MED ORDER — DEXAMETHASONE SODIUM PHOSPHATE 10 MG/ML IJ SOLN
INTRAMUSCULAR | Status: AC
  Filled 2017-09-27: qty 1

## 2017-09-27 MED ORDER — PROMETHAZINE HCL 25 MG/ML IJ SOLN: 6.2500 mg | INTRAMUSCULAR | Status: DC | PRN

## 2017-09-27 MED ORDER — LIDOCAINE 2% (20 MG/ML) 5 ML SYRINGE
INTRAMUSCULAR | Status: DC | PRN
  Administered 2017-09-27: 100 mg via INTRAVENOUS

## 2017-09-27 MED ORDER — AMLODIPINE BESYLATE 5 MG PO TABS
5.0000 mg | ORAL_TABLET | Freq: Every day | ORAL | Status: DC
  Administered 2017-09-28: 5 mg via ORAL
  Filled 2017-09-27: qty 1

## 2017-09-27 MED ORDER — MIDAZOLAM HCL 5 MG/5ML IJ SOLN
INTRAMUSCULAR | Status: DC | PRN
  Administered 2017-09-27: 2 mg via INTRAVENOUS

## 2017-09-27 MED ORDER — PROPOFOL 10 MG/ML IV BOLUS
INTRAVENOUS | Status: DC | PRN
  Administered 2017-09-27: 200 mg via INTRAVENOUS

## 2017-09-27 MED ORDER — SODIUM CHLORIDE 0.9 % IR SOLN
Status: DC | PRN
  Administered 2017-09-27: 1000 mL

## 2017-09-27 MED ORDER — FENTANYL CITRATE (PF) 100 MCG/2ML IJ SOLN
INTRAMUSCULAR | Status: DC | PRN
  Administered 2017-09-27 (×3): 50 ug via INTRAVENOUS

## 2017-09-27 MED ORDER — HYDROMORPHONE HCL 1 MG/ML IJ SOLN: 0.5000 mg | INTRAMUSCULAR | Status: DC | PRN

## 2017-09-27 MED ORDER — FENTANYL CITRATE (PF) 250 MCG/5ML IJ SOLN
INTRAMUSCULAR | Status: AC
  Filled 2017-09-27: qty 5

## 2017-09-27 MED ORDER — HYDROMORPHONE HCL 1 MG/ML IJ SOLN
INTRAMUSCULAR | Status: AC
  Filled 2017-09-27: qty 1

## 2017-09-27 MED ORDER — OXYCODONE HCL 5 MG PO TABS
5.0000 mg | ORAL_TABLET | Freq: Four times a day (QID) | ORAL | Status: DC | PRN
  Administered 2017-09-27 – 2017-09-28 (×3): 5 mg via ORAL
  Filled 2017-09-27 (×4): qty 1

## 2017-09-27 MED ORDER — ONDANSETRON HCL 4 MG/2ML IJ SOLN
INTRAMUSCULAR | Status: DC | PRN
  Administered 2017-09-27: 4 mg via INTRAVENOUS

## 2017-09-27 MED ORDER — ONDANSETRON HCL 4 MG/2ML IJ SOLN
INTRAMUSCULAR | Status: AC
  Filled 2017-09-27: qty 2

## 2017-09-27 MED ORDER — SUGAMMADEX SODIUM 200 MG/2ML IV SOLN
INTRAVENOUS | Status: DC | PRN
  Administered 2017-09-27: 200 mg via INTRAVENOUS

## 2017-09-27 MED ORDER — LIDOCAINE 2% (20 MG/ML) 5 ML SYRINGE
INTRAMUSCULAR | Status: AC
  Filled 2017-09-27: qty 5

## 2017-09-27 MED ORDER — KETOROLAC TROMETHAMINE 30 MG/ML IJ SOLN: 30.0000 mg | Freq: Once | INTRAMUSCULAR | Status: DC | PRN

## 2017-09-27 SURGICAL SUPPLY — 48 items
APL SKNCLS STERI-STRIP NONHPOA (GAUZE/BANDAGES/DRESSINGS) ×2
APPLIER CLIP 5 13 M/L LIGAMAX5 (MISCELLANEOUS)
APPLIER CLIP ROT 10 11.4 M/L (STAPLE)
APR CLP MED LRG 11.4X10 (STAPLE)
APR CLP MED LRG 5 ANG JAW (MISCELLANEOUS)
BAG SPEC RTRVL 10 TROC 200 (ENDOMECHANICALS) ×1
BANDAGE ADH SHEER 1  50/CT (GAUZE/BANDAGES/DRESSINGS) ×15 IMPLANT
BENZOIN TINCTURE PRP APPL 2/3 (GAUZE/BANDAGES/DRESSINGS) ×6 IMPLANT
CABLE HIGH FREQUENCY MONO STRZ (ELECTRODE) IMPLANT
CLIP APPLIE 5 13 M/L LIGAMAX5 (MISCELLANEOUS) IMPLANT
CLIP APPLIE ROT 10 11.4 M/L (STAPLE) IMPLANT
CLIP VESOLOCK XL 6/CT (CLIP) ×3 IMPLANT
CLOSURE STERI-STRIP 1/4X4 (GAUZE/BANDAGES/DRESSINGS) ×3 IMPLANT
CLOSURE WOUND 1/2 X4 (GAUZE/BANDAGES/DRESSINGS) ×1
COVER SURGICAL LIGHT HANDLE (MISCELLANEOUS) ×3 IMPLANT
CUTTER FLEX LINEAR 45M (STAPLE) IMPLANT
DECANTER SPIKE VIAL GLASS SM (MISCELLANEOUS) ×3 IMPLANT
DRAIN CHANNEL 19F RND (DRAIN) IMPLANT
DRAPE LAPAROSCOPIC ABDOMINAL (DRAPES) ×3 IMPLANT
ELECT REM PT RETURN 15FT ADLT (MISCELLANEOUS) ×3 IMPLANT
ENDOLOOP SUT PDS II  0 18 (SUTURE)
ENDOLOOP SUT PDS II 0 18 (SUTURE) IMPLANT
EVACUATOR SILICONE 100CC (DRAIN) IMPLANT
GLOVE BIOGEL PI IND STRL 7.0 (GLOVE) ×1 IMPLANT
GLOVE BIOGEL PI INDICATOR 7.0 (GLOVE) ×2
GLOVE SURG SS PI 7.0 STRL IVOR (GLOVE) ×3 IMPLANT
GOWN STRL REUS W/TWL LRG LVL3 (GOWN DISPOSABLE) ×3 IMPLANT
GOWN STRL REUS W/TWL XL LVL3 (GOWN DISPOSABLE) IMPLANT
GRASPER SUT TROCAR 14GX15 (MISCELLANEOUS) IMPLANT
KIT BASIN OR (CUSTOM PROCEDURE TRAY) ×3 IMPLANT
POUCH RETRIEVAL ECOSAC 10 (ENDOMECHANICALS) ×1 IMPLANT
POUCH RETRIEVAL ECOSAC 10MM (ENDOMECHANICALS) ×2
RELOAD 45 VASCULAR/THIN (ENDOMECHANICALS) IMPLANT
RELOAD STAPLE 45 3.5 BLU ETS (ENDOMECHANICALS) IMPLANT
RELOAD STAPLE TA45 3.5 REG BLU (ENDOMECHANICALS) IMPLANT
SCISSORS LAP 5X35 DISP (ENDOMECHANICALS) ×3 IMPLANT
SET IRRIG TUBING LAPAROSCOPIC (IRRIGATION / IRRIGATOR) ×3 IMPLANT
SHEARS HARMONIC ACE PLUS 36CM (ENDOMECHANICALS) IMPLANT
SLEEVE XCEL OPT CAN 5 100 (ENDOMECHANICALS) ×3 IMPLANT
STRIP CLOSURE SKIN 1/2X4 (GAUZE/BANDAGES/DRESSINGS) ×2 IMPLANT
SUT ETHILON 2 0 PS N (SUTURE) IMPLANT
SUT MNCRL AB 4-0 PS2 18 (SUTURE) ×3 IMPLANT
TOWEL OR 17X26 10 PK STRL BLUE (TOWEL DISPOSABLE) ×3 IMPLANT
TOWEL OR NON WOVEN STRL DISP B (DISPOSABLE) ×3 IMPLANT
TRAY LAPAROSCOPIC (CUSTOM PROCEDURE TRAY) ×3 IMPLANT
TROCAR BLADELESS OPT 5 100 (ENDOMECHANICALS) ×3 IMPLANT
TROCAR XCEL 12X100 BLDLESS (ENDOMECHANICALS) ×3 IMPLANT
TUBING INSUF HEATED (TUBING) ×3 IMPLANT

## 2017-09-27 NOTE — Anesthesia Procedure Notes (Signed)
Procedure Name: Intubation Date/Time: 09/27/2017 7:44 AM Performed by: Victoriano Lain, CRNA Pre-anesthesia Checklist: Patient identified, Emergency Drugs available, Suction available, Timeout performed and Patient being monitored Patient Re-evaluated:Patient Re-evaluated prior to induction Oxygen Delivery Method: Circle system utilized Preoxygenation: Pre-oxygenation with 100% oxygen Induction Type: IV induction Ventilation: Mask ventilation without difficulty and Oral airway inserted - appropriate to patient size Laryngoscope Size: Mac and 4 Grade View: Grade I Tube type: Oral Tube size: 7.5 mm Number of attempts: 1 Airway Equipment and Method: Stylet Placement Confirmation: ETT inserted through vocal cords under direct vision,  positive ETCO2 and breath sounds checked- equal and bilateral Secured at: 22 cm Tube secured with: Tape Dental Injury: Teeth and Oropharynx as per pre-operative assessment

## 2017-09-27 NOTE — Discharge Instructions (Signed)
[facility] ______[facility] °LAPAROSCOPIC SURGERY: POST OP INSTRUCTIONS °Always review your discharge instruction sheet given to you by the facility where your surgery was performed. °IF YOU HAVE DISABILITY OR FAMILY LEAVE FORMS, YOU MUST BRING THEM TO THE OFFICE FOR PROCESSING.   °DO NOT GIVE THEM TO YOUR DOCTOR. ° °1. A prescription for pain medication may be given to you upon discharge.  Take your pain medication as prescribed, if needed.  If narcotic pain medicine is not needed, then you may take acetaminophen (Tylenol) or ibuprofen (Advil) as needed. °2. Take your usually prescribed medications unless otherwise directed. °3. If you need a refill on your pain medication, please contact your pharmacy.  They will contact our office to request authorization. Prescriptions will not be filled after 5pm or on week-ends. °4. You should follow a light diet the first few days after arrival home, such as soup and crackers, etc.  Be sure to include lots of fluids daily. °5. Most patients will experience some swelling and bruising in the area of the incisions.  Ice packs will help.  Swelling and bruising can take several days to resolve.  °6. It is common to experience some constipation if taking pain medication after surgery.  Increasing fluid intake and taking a stool softener (such as Colace) will usually help or prevent this problem from occurring.  A mild laxative (Milk of Magnesia or Miralax) should be taken according to package instructions if there are no bowel movements after 48 hours. °7. Unless discharge instructions indicate otherwise, you may remove your bandages 24-48 hours after surgery, and you may shower at that time.  You may have steri-strips (small skin tapes) in place directly over the incision.  These strips should be left on the skin for 7-10 days.  If your surgeon used skin glue on the incision, you may shower in 24 hours.  The glue will flake off over the next 2-3 weeks.  Any sutures or  staples will be removed at the office during your follow-up visit. °8. ACTIVITIES:  You may resume regular (light) daily activities beginning the next day--such as daily self-care, walking, climbing stairs--gradually increasing activities as tolerated.  You may have sexual intercourse when it is comfortable.  Refrain from any heavy lifting or straining until approved by your doctor. °a. You may drive when you are no longer taking prescription pain medication, you can comfortably wear a seatbelt, and you can safely maneuver your car and apply brakes. °b. RETURN TO WORK:  __________________________________________________________ °9. You should see your doctor in the office for a follow-up appointment approximately 2-3 weeks after your surgery.  Make sure that you call for this appointment within a day or two after you arrive home to insure a convenient appointment time. °10. OTHER INSTRUCTIONS: __________________________________________________________________________________________________________________________ __________________________________________________________________________________________________________________________ °WHEN TO CALL YOUR DOCTOR: °1. Fever over 101.0 °2. Inability to urinate °3. Continued bleeding from incision. °4. Increased pain, redness, or drainage from the incision. °5. Increasing abdominal pain ° °The clinic staff is available to answer your questions during regular business hours.  Please don’t hesitate to call and ask to speak to one of the nurses for clinical concerns.  If you have a medical emergency, go to the nearest emergency room or call 911.  A surgeon from [facility] is always on call at the hospital. °[address], [address], [place], [place]  [address] ? [address] [address], [place],    [address] °[phone] ? [phone] ? FAX [phone] °Web site:   [email or web address]   Chronic Kidney Disease, Adult Chronic kidney disease  (CKD) happens when the kidneys are damaged during a time of 3 or more months. The kidneys are two organs that do many important jobs in the body. These jobs include:  Removing wastes and extra fluids from the blood.  Making hormones that maintain the amount of fluid in your tissues and blood vessels.  Making sure that the body has the right amount of fluids and chemicals.  Most of the time, this condition does not go away, but it can usually be controlled. Steps must be taken to slow down the kidney damage or stop it from getting worse. Otherwise, the kidneys may stop working. Follow these instructions at home:  Follow your diet as told by your doctor. You may need to avoid alcohol, salty foods (sodium), and foods that are high in potassium, calcium, and protein.  Take over-the-counter and prescription medicines only as told by your doctor. Do not take any new medicines unless your doctor says you can do that. These include vitamins and minerals. ? Medicines and nutritional supplements can make kidney damage worse. ? Your doctor may need to change how much medicine you take.  Do not use any tobacco products. These include cigarettes, chewing tobacco, and e-cigarettes. If you need help quitting, ask your doctor.  Keep all follow-up visits as told by your doctor. This is important.  Check your blood pressure. Tell your doctor if there are changes to your blood pressure.  Get to a healthy weight. Stay at that weight. If you need help with this, ask your doctor.  Start or continue an exercise plan. Try to exercise at least 30 minutes a day, 5 days a week.  Stay up-to-date with your shots (immunizations) as told by your doctor. Contact a doctor if:  Your symptoms get worse.  You have new symptoms. Get help right away if:  You have symptoms of end-stage kidney disease. These include: ? Headaches. ? Skin that is darker or lighter than normal. ? Numbness in your hands or feet. ? Easy  bruising. ? Having hiccups often. ? Chest pain. ? Shortness of breath. ? Stopping of menstrual periods in women.  You have a fever.  You are making very little pee (urine).  You have pain or bleeding when you pee (urinate). This information is not intended to replace advice given to you by your health care provider. Make sure you discuss any questions you have with your health care provider. Document Released: 09/01/2009 Document Revised: 11/13/2015 Document Reviewed: 02/04/2012 Elsevier Interactive Patient Education  2017 Elsevier Inc.   Hypertension Hypertension is another name for high blood pressure. High blood pressure forces your heart to work harder to pump blood. This can cause problems over time. There are two numbers in a blood pressure reading. There is a top number (systolic) over a bottom number (diastolic). It is best to have a blood pressure below 120/80. Healthy choices can help lower your blood pressure. You may need medicine to help lower your blood pressure if:  Your blood pressure cannot be lowered with healthy choices.  Your blood pressure is higher than 130/80.  Follow these instructions at home: Eating and drinking  If directed, follow the DASH eating plan. This diet includes: ? Filling half of your plate at each meal with fruits and vegetables. ? Filling one quarter of your plate at each meal with whole grains. Whole grains include whole wheat pasta, brown rice, and whole grain bread. ?  Eating or drinking low-fat dairy products, such as skim milk or low-fat yogurt. ? Filling one quarter of your plate at each meal with low-fat (lean) proteins. Low-fat proteins include fish, skinless chicken, eggs, beans, and tofu. ? Avoiding fatty meat, cured and processed meat, or chicken with skin. ? Avoiding premade or processed food.  Eat less than 1,500 mg of salt (sodium) a day.  Limit alcohol use to no more than 1 drink a day for nonpregnant women and 2 drinks a day  for men. One drink equals 12 oz of beer, 5 oz of wine, or 1 oz of hard liquor. Lifestyle  Work with your doctor to stay at a healthy weight or to lose weight. Ask your doctor what the best weight is for you.  Get at least 30 minutes of exercise that causes your heart to beat faster (aerobic exercise) most days of the week. This may include walking, swimming, or biking.  Get at least 30 minutes of exercise that strengthens your muscles (resistance exercise) at least 3 days a week. This may include lifting weights or pilates.  Do not use any products that contain nicotine or tobacco. This includes cigarettes and e-cigarettes. If you need help quitting, ask your doctor.  Check your blood pressure at home as told by your doctor.  Keep all follow-up visits as told by your doctor. This is important. Medicines  Take over-the-counter and prescription medicines only as told by your doctor. Follow directions carefully.  Do not skip doses of blood pressure medicine. The medicine does not work as well if you skip doses. Skipping doses also puts you at risk for problems.  Ask your doctor about side effects or reactions to medicines that you should watch for. Contact a doctor if:  You think you are having a reaction to the medicine you are taking.  You have headaches that keep coming back (recurring).  You feel dizzy.  You have swelling in your ankles.  You have trouble with your vision. Get help right away if:  You get a very bad headache.  You start to feel confused.  You feel weak or numb.  You feel faint.  You get very bad pain in your: ? Chest. ? Belly (abdomen).  You throw up (vomit) more than once.  You have trouble breathing. Summary  Hypertension is another name for high blood pressure.  Making healthy choices can help lower blood pressure. If your blood pressure cannot be controlled with healthy choices, you may need to take medicine. This information is not  intended to replace advice given to you by your health care provider. Make sure you discuss any questions you have with your health care provider. Document Released: 11/24/2007 Document Revised: 05/05/2016 Document Reviewed: 05/05/2016 Elsevier Interactive Patient Education  2018 [employer]   Preventing Type 2 Diabetes Mellitus Type 2 diabetes (type 2 diabetes mellitus) is a long-term (chronic) disease that affects blood sugar (glucose) levels. Normally, a hormone called insulin allows glucose to enter cells in the body. The cells use glucose for energy. In type 2 diabetes, one or both of these problems may be present:  The body does not make enough insulin.  The body does not respond properly to insulin that it makes (insulin resistance).  Insulin resistance or lack of insulin causes excess glucose to build up in the blood instead of going into cells. As a result, high blood glucose (hyperglycemia) develops, which can cause many complications. Being overweight or obese and having an inactive (  sedentary) lifestyle can increase your risk for diabetes. Type 2 diabetes can be delayed or prevented by making certain nutrition and lifestyle changes. What nutrition changes can be made?  Eat healthy meals and snacks regularly. Keep a healthy snack with you for when you get hungry between meals, such as fruit or a handful of nuts.  Eat lean meats and proteins that are low in saturated fats, such as chicken, fish, egg whites, and beans. Avoid processed meats.  Eat plenty of fruits and vegetables and plenty of grains that have not been processed (whole grains). It is recommended that you eat: ? 1?2 cups of fruit every day. ? 2?3 cups of vegetables every day. ? 6?8 oz of whole grains every day, such as oats, whole wheat, bulgur, brown rice, quinoa, and millet.  Eat low-fat dairy products, such as milk, yogurt, and cheese.  Eat foods that contain healthy fats, such as nuts, avocado, olive oil, and  canola oil.  Drink water throughout the day. Avoid drinks that contain added sugar, such as soda or sweet tea.  Follow instructions from your health care provider about specific eating or drinking restrictions.  Control how much food you eat at a time (portion size). ? Check food labels to find out the serving sizes of foods. ? Use a kitchen scale to weigh amounts of foods.  Saute or steam food instead of frying it. Cook with water or broth instead of oils or butter.  Limit your intake of: ? Salt (sodium). Have no more than 1 tsp (2,400 mg) of sodium a day. If you have heart disease or high blood pressure, have less than ? tsp (1,500 mg) of sodium a day. ? Saturated fat. This is fat that is solid at room temperature, such as butter or fat on meat. What lifestyle changes can be made?  Activity  Do moderate-intensity physical activity for at least 30 minutes on at least 5 days of the week, or as much as told by your health care provider.  Ask your health care provider what activities are safe for you. A mix of physical activities may be best, such as walking, swimming, cycling, and strength training.  Try to add physical activity into your day. For example: ? Park in spots that are farther away than usual, so that you walk more. For example, park in a far corner of the parking lot when you go to the office or the grocery store. ? Take a walk during your lunch break. ? Use stairs instead of elevators or escalators. Weight Loss  Lose weight as directed. Your health care provider can determine how much weight loss is best for you and can help you lose weight safely.  If you are overweight or obese, you may be instructed to lose at least 5?7 % of your body weight. Alcohol and Tobacco   Limit alcohol intake to no more than 1 drink a day for nonpregnant women and 2 drinks a day for men. One drink equals 12 oz of beer, 5 oz of wine, or 1 oz of hard liquor.  Do not use any tobacco  products, such as cigarettes, chewing tobacco, and e-cigarettes. If you need help quitting, ask your health care provider. Work With Your Health Care Provider  Have your blood glucose tested regularly, as told by your health care provider.  Discuss your risk factors and how you can reduce your risk for diabetes.  Get screening tests as told by your health care provider. You may  have screening tests regularly, especially if you have certain risk factors for type 2 diabetes.  Make an appointment with a diet and nutrition specialist (registered dietitian). A registered dietitian can help you make a healthy eating plan and can help you understand portion sizes and food labels. Why are these changes important?  It is possible to prevent or delay type 2 diabetes and related health problems by making lifestyle and nutrition changes.  It can be difficult to recognize signs of type 2 diabetes. The best way to avoid possible damage to your body is to take actions to prevent the disease before you develop symptoms. What can happen if changes are not made?  Your blood glucose levels may keep increasing. Having high blood glucose for a long time is dangerous. Too much glucose in your blood can damage your blood vessels, heart, kidneys, nerves, and eyes.  You may develop prediabetes or type 2 diabetes. Type 2 diabetes can lead to many chronic health problems and complications, such as: ? Heart disease. ? Stroke. ? Blindness. ? Kidney disease. ? Depression. ? Poor circulation in the feet and legs, which could lead to surgical removal (amputation) in severe cases. Where to find support:  Ask your health care provider to recommend a registered dietitian, diabetes educator, or weight loss program.  Look for local or online weight loss groups.  Join a gym, fitness club, or outdoor activity group, such as a walking club. Where to find more information: To learn more about diabetes and diabetes  prevention, visit:  American Diabetes Association (ADA): www.diabetes.[employer] of Diabetes and Digestive and Kidney Diseases: ToyArticles.ca  To learn more about healthy eating, visit:  The U.S. Department of Agriculture Architect), Choose My Plate: http://yates.biz/  Office of Disease Prevention and Health Promotion (ODPHP), Dietary Guidelines: ListingMagazine.si  Summary  You can reduce your risk for type 2 diabetes by increasing your physical activity, eating healthy foods, and losing weight as directed.  Talk with your health care provider about your risk for type 2 diabetes. Ask about any blood tests or screening tests that you need to have. This information is not intended to replace advice given to you by your health care provider. Make sure you discuss any questions you have with your health care provider. Document Released: 09/29/2015 Document Revised: 11/13/2015 Document Reviewed: 07/29/2015 Elsevier Interactive Patient Education  [employer].

## 2017-09-27 NOTE — Op Note (Signed)
Preoperative diagnosis: acute appendicitis  Postoperative diagnosis: acute gangrenous appendicitis  Procedure: laparoscopic appendectomy  Surgeon: Feliciana RossettiLuke Kylen Ismael, M.D.  Asst: none  Anesthesia: Gen.   Indications for procedure: Hector SmokerRobert T Wallace is a 37 y.o. male with symptoms of pain in right lower quadrant and nausea consistent with acute appendicitis. Confirmed by CT scan and laboratory values.  Description of procedure: The patient was brought into the operative suite, placed supine. Anesthesia was administered with endotracheal tube. The patient's left arm was tucked. All pressure points were offloaded by foam padding. The patient was prepped and draped in the usual sterile fashion.  A transverse incision was made to the left of the umbilicus and a 5mm trocar was us. Pneumoperitoneum was applied with high flow low pressure.  2 5mm trocars were placed, one in the suprapubic space, one in the LLQ, the periumbilical incision was then up-sized and a 11mm trocar placed in that space. All trocars sites were first anesthesized with Marcaine. Next the patient was placed in trendelenberg, rotated to the left. The omentum was retracted cephalad. The cecum and appendix were identified. The appendix was lateral to the colon and was gangrenous in appearance. There was murky fluid around the appendix and the pelvis. This fluid was removed. There were filmy adhesions from the abdominal wall to the terminal ileum which were sharply dissected free to identify the base of the appendix. The base of the appendix was dissected and a window through the mesoappendix was created with blunt dissection. Large Hem-o-lock clips were used to doubly ligate the base of the appendix and mesoappendix. The appendix was cut free with scissors.  The appendix was placed in a specimen bag. The pelvis and RLQ were irrigated. The appendix was removed via the umbilicus. 0 vicryl was used to close the fascial defect. Pneumoperitoneum was  removed, all trocars were removed. All incisions were closed with 4-0 monocryl subcuticular stitch. The patient woke from anesthesia and was brought to PACU in stable condition.  Findings: gangrenous appendicitis  Specimen: appendix  Blood loss: 30 ml  Local anesthesia: 30 ml marcaine  Complications: none  Feliciana RossettiLuke Itzae Miralles, M.D. General, Bariatric, & Minimally Invasive Surgery [facility]
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
## 2017-09-27 NOTE — Transfer of Care (Signed)
Immediate Anesthesia Transfer of Care Note  Patient: Hector SmokerRobert T Rumple  Procedure(s) Performed: APPENDECTOMY LAPAROSCOPIC (N/A Abdomen)  Patient Location: PACU  Anesthesia Type:General  Level of Consciousness: awake, alert , oriented and patient cooperative  Airway & Oxygen Therapy: Patient Spontanous Breathing and Patient connected to face mask oxygen  Post-op Assessment: Report given to RN, Post -op Vital signs reviewed and stable and Patient moving all extremities  Post vital signs: Reviewed and stable  Last Vitals:  Vitals Value Taken Time  BP    Temp    Pulse 83 09/27/2017  8:50 AM  Resp 31 09/27/2017  8:50 AM  SpO2 100 % 09/27/2017  8:50 AM  Vitals shown include unvalidated device data.  Last Pain:  Vitals:   09/27/17 0634  TempSrc: Oral  PainSc:       Patients Stated Pain Goal: 2 (09/26/17 2310)  Complications: No apparent anesthesia complications

## 2017-09-27 NOTE — Anesthesia Postprocedure Evaluation (Signed)
Anesthesia Post Note  Patient: Hector SmokerRobert T Wallace  Procedure(s) Performed: APPENDECTOMY LAPAROSCOPIC (N/A Abdomen)     Patient location during evaluation: PACU Anesthesia Type: General Level of consciousness: awake and alert Pain management: pain level controlled Vital Signs Assessment: post-procedure vital signs reviewed and stable Respiratory status: spontaneous breathing, nonlabored ventilation, respiratory function stable and patient connected to nasal cannula oxygen Cardiovascular status: blood pressure returned to baseline and stable Postop Assessment: no apparent nausea or vomiting Anesthetic complications: no    Last Vitals:  Vitals:   09/27/17 0915 09/27/17 0930  BP: (!) 146/94 (!) 150/86  Pulse: 74 67  Resp: 15 14  Temp:    SpO2: 100% 100%    Last Pain:  Vitals:   09/27/17 0910  TempSrc:   PainSc: 4                  Crystalle Popwell S

## 2017-09-27 NOTE — Progress Notes (Signed)
Transported to OR at [id number]

## 2017-09-27 NOTE — Discharge Summary (Signed)
Physician Discharge Summary  Patient ID: Hector Wallace MRN: [id number] DOB/AGE: March 01, 1981 37 y.o.  Admit date: 09/26/2017 Discharge date: 09/28/2017  Admission Diagnoses:  Acute appendicitis Hypertension - poor control Chronic kidney disease Hyperglycemia  Discharge Diagnoses:  Same  Principal Problem:   Acute appendicitis Active Problems:   CKD (chronic kidney disease) stage 2, GFR 60-89 ml/min   HTN (hypertension)   Obesity (BMI 30-39.9)   Poorly-controlled hypertension   PROCEDURES: laparoscopic appendectomy, 09/27/17, Dr. Sheliah Hatch   HPI:  Pleasant obese male history of blood pressure.  Woke up this morning with severe crampy abdominal pain.  Nausea and vomiting.  Tried Pepto-Bismol.   Could not really keep it down.  Felt worse.  Severe pain with coughing and hiccuping.  Mother took him to the emergency room.  Uncomfortable ride.  Patient with elevated blood pressure severe abdominal pain.  No fever & no elevated white count.  Exam more concerning for right-sided abdominal pain.  Rather intense.  CT scan ordered.  Suspicious for appendicitis.  Surgical consultation requested. Patient has history of mildly elevated creatinine elevated blood pressures on last hospital visit femur fracture.  Blood pressure 180/100.  Apparently has primary care physician at urgent care center [facility].  Not currently on any blood pressure medicines.  Normally can walk an hour without difficulty.  Usually moves his bowels 2-3 times a day.  Hospital Course: Pt was admitted thru the ED by Dr. Michaell Cowing.  He has several medical issues and was seen by Medicine for this.  He has chronic kidney disease, hypertension, with poor control, Hemoglobin A1C is 5.9; prediabetes and his BMI is 30.  Medical recommendations will be continued at discharge.  He needs to follow up with his PCP.  He underwent laparoscopic appendectomy in the AM 09/27/17 by Dr. Sheliah Hatch.  He did well from his surgery. Diet was advanced as  tolerated. Pt was started on Norvasc for his BP. He has been instructed to f/u with his PCP in 2 weeks to have his BP and kidney function checked. He knows to f/u with [facility] in 2 weeks. He knows to call with questions or concerns. On POD#1 the pt was voiding, ambulating well, VSS, tolerating diet and felt stable for discharge home. Pt was discharged in good condition.  The [place] substance control database was reviewed prior to prescribing pain medication to this patient.   Physical Exam: General: well appearing, NAD, pleasant Lung: CTA b/l, no wheezes or rales, rate and effort normal Cardio: RRR, no murmur appreciated GI: soft, not distended, good BS, incisions C/D/I, mild TTP around incisions without guarding Extremities: no tenderness in b/l calves Skin: warm and dry no rashes noted  Allergies as of 09/28/2017      Reactions   Pork-derived Products    "throat closes"      Medication List    STOP taking these medications   methocarbamol 500 MG tablet Commonly known as:  ROBAXIN     TAKE these medications   amLODipine 5 MG tablet Commonly known as:  NORVASC Take 1 tablet (5 mg total) by mouth daily.   aspirin 325 MG EC tablet Take 1 tablet (325 mg total) by mouth daily with breakfast.   bismuth subsalicylate 262 MG/15ML suspension Commonly known as:  PEPTO BISMOL Take 30 mLs by mouth every 6 (six) hours as needed for indigestion.   oxyCODONE 5 MG immediate release tablet Commonly known as:  Oxy IR/ROXICODONE Take 1 tablet (5 mg total) by mouth every 6 (six) hours  as needed for moderate pain.   simethicone 80 MG chewable tablet Commonly known as:  MYLICON Chew 80 mg by mouth every 6 (six) hours as needed for flatulence.        Follow-up Information    [facility] up on 10/18/2017.   Specialty:  General Surgery Why:  your appointment is at 9:15AM,  Be at the office 30 minutes early for check in.  Bring photo ID and insurance information. Contact  information: [address] [place] [address] [phone]        Lindaann PascalLong, Scott, PA-C Follow up.   Specialty:  Physician Assistant Why:  You need to see them about your kidney disease, hypertension, and glucose levels.  Make an appointment in 1-2 weeks so they can follow up with you.   Contact information: [address] [place] [phone]           Signed: Mattie MarlinJessica Yochanan Eddleman, [facility] Pager [phone]
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
## 2017-09-27 NOTE — Anesthesia Preprocedure Evaluation (Signed)
Anesthesia Evaluation  Patient identified by MRN, date of birth, ID band Patient awake    Reviewed: Allergy & Precautions, NPO status , Patient's Chart, lab work & pertinent test results  Airway Mallampati: II  TM Distance: >3 FB Neck ROM: Full    Dental no notable dental hx. (+) Teeth Intact   Pulmonary neg pulmonary ROS, Current Smoker,    Pulmonary exam normal breath sounds clear to auscultation       Cardiovascular Normal cardiovascular exam Rhythm:Regular Rate:Normal     Neuro/Psych negative neurological ROS  negative psych ROS   GI/Hepatic negative GI ROS, Neg liver ROS,   Endo/Other  negative endocrine ROS  Renal/GU   negative genitourinary   Musculoskeletal negative musculoskeletal ROS (+)   Abdominal Normal abdominal exam  (+)   Peds  Hematology negative hematology ROS (+)   Anesthesia Other Findings   Reproductive/Obstetrics                             Anesthesia Physical Anesthesia Plan  ASA: I  Anesthesia Plan: General   Post-op Pain Management:    Induction: Intravenous  PONV Risk Score and Plan: 3 and Ondansetron, Dexamethasone and Scopolamine patch - Pre-op  Airway Management Planned: Oral ETT  Additional Equipment:   Intra-op Plan:   Post-operative Plan: Extubation in OR  Informed Consent: I have reviewed the patients History and Physical, chart, labs and discussed the procedure including the risks, benefits and alternatives for the proposed anesthesia with the patient or authorized representative who has indicated his/her understanding and acceptance.   Dental advisory given  Plan Discussed with: CRNA and Surgeon  Anesthesia Plan Comments:         Anesthesia Quick Evaluation

## 2017-09-27 NOTE — Interval H&P Note (Signed)
History and Physical Interval Note:  09/27/2017 7:28 AM  Hector Wallace  has presented today for surgery, with the diagnosis of appendicitis  The various methods of treatment have been discussed with the patient and family. After consideration of risks, benefits and other options for treatment, the patient has consented to  Procedure(s): APPENDECTOMY LAPAROSCOPIC (N/A) as a surgical intervention .  The patient's history has been reviewed, patient examined, no change in status, stable for surgery.  I have reviewed the patient's chart and labs.  Questions were answered to the patient's satisfaction.     De BlanchLuke Aaron Kinsinger

## 2017-09-28 LAB — BASIC METABOLIC PANEL
Anion gap: 10 (ref 5–15)
BUN: 17 mg/dL (ref 6–20)
CALCIUM: 8.5 mg/dL — AB (ref 8.9–10.3)
CO2: 25 mmol/L (ref 22–32)
CREATININE: 1.65 mg/dL — AB (ref 0.61–1.24)
Chloride: 105 mmol/L (ref 101–111)
GFR calc non Af Amer: 51 mL/min — ABNORMAL LOW (ref >60)
GFR, EST AFRICAN AMERICAN: 59 mL/min — AB (ref >60)
GLUCOSE: 127 mg/dL — AB (ref 65–99)
Potassium: 4.1 mmol/L (ref 3.5–5.1)
Sodium: 140 mmol/L (ref 135–145)

## 2017-09-28 MED ORDER — AMLODIPINE BESYLATE 5 MG PO TABS: 5.0000 mg | ORAL_TABLET | Freq: Every day | ORAL | 1 refills | Status: DC

## 2017-09-28 MED ORDER — OXYCODONE HCL 5 MG PO TABS: 5.0000 mg | ORAL_TABLET | Freq: Four times a day (QID) | ORAL | 0 refills | Status: DC | PRN

## 2017-09-28 MED ORDER — PROCHLORPERAZINE MALEATE 10 MG PO TABS
5.0000 mg | ORAL_TABLET | Freq: Four times a day (QID) | ORAL | Status: DC | PRN
  Administered 2017-09-28: 5 mg via ORAL
  Filled 2017-09-28: qty 1

## 2017-09-28 MED ORDER — PANTOPRAZOLE SODIUM 40 MG IV SOLR
40.0000 mg | Freq: Once | INTRAVENOUS | Status: AC
  Administered 2017-09-28: 40 mg via INTRAVENOUS
  Filled 2017-09-28: qty 40

## 2017-09-28 MED ORDER — ALUM & MAG HYDROXIDE-SIMETH 200-200-20 MG/5ML PO SUSP: 30.0000 mL | Freq: Four times a day (QID) | ORAL | Status: DC | PRN

## 2017-09-28 MED ORDER — SIMETHICONE 80 MG PO CHEW: 40.0000 mg | CHEWABLE_TABLET | Freq: Four times a day (QID) | ORAL | Status: DC | PRN

## 2017-09-28 MED ORDER — BISMUTH SUBSALICYLATE 262 MG/15ML PO SUSP
30.0000 mL | Freq: Four times a day (QID) | ORAL | Status: DC | PRN
  Filled 2017-09-28: qty 236

## 2017-09-28 NOTE — Progress Notes (Signed)
Discharge instructions reviewed with patient. Questions answered. Patient denies further questions. Family member coming to drive patient home. One prescription given to patient. Lina SarBeth Hennesy Sobalvarro, RN

## 2017-09-28 NOTE — Progress Notes (Signed)
Patient had one episode of HTN; repeat BP with both feet uncrossed and on floor was improved. Patient c/o constant hiccups. Jessica PA paged to confirm patient still able to be DC's. Lina SarBeth Sueo Cullen, RN

## 2020-10-27 ENCOUNTER — Ambulatory Visit (INDEPENDENT_AMBULATORY_CARE_PROVIDER_SITE_OTHER): Payer: Self-pay | Admitting: Internal Medicine

## 2020-10-27 ENCOUNTER — Encounter: Payer: Self-pay | Admitting: Internal Medicine

## 2020-10-27 ENCOUNTER — Other Ambulatory Visit: Payer: Self-pay

## 2020-10-27 VITALS — BP 152/92 | HR 82 | Ht 69.0 in | Wt 216.5 lb

## 2020-10-27 DIAGNOSIS — E1165 Type 2 diabetes mellitus with hyperglycemia: Secondary | ICD-10-CM

## 2020-10-27 DIAGNOSIS — E1122 Type 2 diabetes mellitus with diabetic chronic kidney disease: Secondary | ICD-10-CM | POA: Insufficient documentation

## 2020-10-27 DIAGNOSIS — Z794 Long term (current) use of insulin: Secondary | ICD-10-CM

## 2020-10-27 DIAGNOSIS — E785 Hyperlipidemia, unspecified: Secondary | ICD-10-CM

## 2020-10-27 MED ORDER — LANTUS SOLOSTAR 100 UNIT/ML ~~LOC~~ SOPN: 45.0000 [IU] | PEN_INJECTOR | Freq: Every day | SUBCUTANEOUS | 6 refills | Status: DC

## 2020-10-27 MED ORDER — BD PEN NEEDLE MINI U/F 31G X 5 MM MISC: 1.0000 | Freq: Four times a day (QID) | 2 refills | Status: DC

## 2020-10-27 MED ORDER — FREESTYLE LIBRE 2 SENSOR MISC: 1.0000 | 11 refills | Status: DC

## 2020-10-27 MED ORDER — NOVOLOG FLEXPEN 100 UNIT/ML ~~LOC~~ SOPN: 5.0000 [IU] | PEN_INJECTOR | Freq: Three times a day (TID) | SUBCUTANEOUS | 11 refills | Status: DC
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
## 2020-10-27 NOTE — Patient Instructions (Addendum)
-   Continue Lantus 45 units daily  - Novolog/regular insulin 5 units with each meal  - Novolog correctional insulin: ADD extra units on insulin to your meal-time Novolog dose if your blood sugars are higher than . Use the scale below to help guide you:   Blood sugar before meal Number of units to inject  Less than 155 0 unit  156 -  180 1 units  181 -  205 2 units  206 -  230 3 units  231 -  255 4 units  256 -  280 5 units  281 -  305 6 units  306 -  330 7 units  331 -  355 8 units     HOW TO TREAT LOW BLOOD SUGARS (Blood sugar LESS THAN 70 MG/DL)  Please follow the RULE OF 15 for the treatment of hypoglycemia treatment (when your (blood sugars are less than 70 mg/dL)    STEP 1: Take 15 grams of carbohydrates when your blood sugar is low, which includes:   3-4 GLUCOSE TABS  OR  3-4 OZ OF JUICE OR REGULAR SODA OR  ONE TUBE OF GLUCOSE GEL     STEP 2: RECHECK blood sugar in 15 MINUTES STEP 3: If your blood sugar is still low at the 15 minute recheck --> then, go back to STEP 1 and treat AGAIN with another 15 grams of carbohydrates.

## 2020-11-11 ENCOUNTER — Telehealth: Payer: Self-pay | Admitting: Internal Medicine

## 2020-11-11 NOTE — Telephone Encounter (Signed)
Noted  

## 2020-11-11 NOTE — Telephone Encounter (Signed)
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 Put in Providers tray at front desk

## 2020-11-12 NOTE — Telephone Encounter (Signed)
Placed the form in [facility] inbox to review

## 2020-11-18 ENCOUNTER — Telehealth: Payer: Self-pay | Admitting: Internal Medicine

## 2020-11-18 NOTE — Telephone Encounter (Signed)
Please advise 

## 2020-11-18 NOTE — Telephone Encounter (Signed)
I am sorry, let him that with the 3 day holiday and today I am in [place] , I wasn't able to do it. I will work on it tomorrow

## 2020-11-18 NOTE — Telephone Encounter (Signed)
New message    Patient checking on the status of paperwork drop off on 5.24.22

## 2020-11-19 ENCOUNTER — Encounter: Payer: Self-pay | Admitting: Internal Medicine

## 2020-11-19 NOTE — Telephone Encounter (Signed)
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

## 2020-12-08 ENCOUNTER — Ambulatory Visit: Payer: Self-pay | Admitting: Family Medicine

## 2020-12-08 ENCOUNTER — Ambulatory Visit: Payer: Self-pay | Admitting: Registered"

## 2020-12-08 NOTE — Progress Notes (Deleted)
Diagnosed with DM 09/2020 Prior Medications tried/Intolerance: N/A Currently checking blood sugars 3-4 x / day. Hypoglycemia episodes : no               Hemoglobin A1c was 13.5%. Patient required assistance for hypoglycemia: Patient has required hospitalization within the last 1 year from hyper or hypoglycemia:   In terms of diet, the patient eats 1-2 meals a day        Pt had an admission in 09/2020 with a serum glucose of 698 mg/dL with an elevated AG at 17 mmol/L ( reference 5-15) , low CO2 at 18 ( 20-32) elevated BHB 11.4 (0.0-0.3) he was also noted with acute renal failure GFR 26 METER DOWNLOAD SUMMARY: Date range evaluated: 4/25-10/27/2020   Average Number Tests/Day = 2.3 Overall Mean FS Glucose = 205 Standard Deviation = 87   BG Ranges: Low = 86 High = 420  Continue Lantus 45 units daily  Start Novolog 5 units with each meal  CF: NOvolog (BG -130/25)  Instructed SMBG 3x/before meals

## 2021-02-02 ENCOUNTER — Ambulatory Visit: Payer: Self-pay | Admitting: Internal Medicine
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
## 2021-11-11 ENCOUNTER — Other Ambulatory Visit: Payer: Self-pay | Admitting: Internal Medicine

## 2021-11-23 ENCOUNTER — Other Ambulatory Visit: Payer: Self-pay | Admitting: Internal Medicine

## 2021-11-23 ENCOUNTER — Telehealth: Payer: Self-pay | Admitting: Internal Medicine

## 2021-11-23 MED ORDER — BASAGLAR KWIKPEN 100 UNIT/ML ~~LOC~~ SOPN: 45.0000 [IU] | PEN_INJECTOR | Freq: Every day | SUBCUTANEOUS | 0 refills | Status: DC

## 2021-11-23 NOTE — Telephone Encounter (Signed)
Patient has been notified and will keep upcoming appointment

## 2021-11-23 NOTE — Telephone Encounter (Signed)
Patient called to advise that the Lantus is not covered by his insurance. He called and spoke with his insurance provider and they will cover [place] or [place].  Patient requesting RX for either be sent to the [facility] on [address]  He is completely out.
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
## 2022-03-19 ENCOUNTER — Ambulatory Visit (INDEPENDENT_AMBULATORY_CARE_PROVIDER_SITE_OTHER): Payer: Self-pay | Admitting: Internal Medicine

## 2022-03-19 ENCOUNTER — Encounter: Payer: Self-pay | Admitting: Internal Medicine

## 2022-03-19 VITALS — BP 134/80 | HR 90 | Ht 69.0 in | Wt 217.8 lb

## 2022-03-19 DIAGNOSIS — E785 Hyperlipidemia, unspecified: Secondary | ICD-10-CM

## 2022-03-19 DIAGNOSIS — E119 Type 2 diabetes mellitus without complications: Secondary | ICD-10-CM

## 2022-03-19 DIAGNOSIS — Z794 Long term (current) use of insulin: Secondary | ICD-10-CM

## 2022-03-19 DIAGNOSIS — E1165 Type 2 diabetes mellitus with hyperglycemia: Secondary | ICD-10-CM

## 2022-03-19 LAB — POCT GLYCOSYLATED HEMOGLOBIN (HGB A1C): Hemoglobin A1C: 5.9 % — AB (ref 4.0–5.6)

## 2022-03-19 LAB — LIPID PANEL
Cholesterol: 168 mg/dL (ref 0–200)
HDL: 42.4 mg/dL (ref >39.00)
LDL Cholesterol: 109 mg/dL — ABNORMAL HIGH (ref 0–99)
NonHDL: 125.13
Total CHOL/HDL Ratio: 4
Triglycerides: 83 mg/dL (ref 0.0–149.0)
VLDL: 16.6 mg/dL (ref 0.0–40.0)

## 2022-03-19 LAB — BASIC METABOLIC PANEL
BUN: 19 mg/dL (ref 6–23)
CO2: 28 mEq/L (ref 19–32)
Calcium: 9.3 mg/dL (ref 8.4–10.5)
Chloride: 103 mEq/L (ref 96–112)
Creatinine, Ser: 1.88 mg/dL — ABNORMAL HIGH (ref 0.40–1.50)
GFR: 43.3 mL/min — ABNORMAL LOW (ref >60.00)
Glucose, Bld: 120 mg/dL — ABNORMAL HIGH (ref 70–99)
Potassium: 4.3 mEq/L (ref 3.5–5.1)
Sodium: 139 mEq/L (ref 135–145)

## 2022-03-19 LAB — MICROALBUMIN / CREATININE URINE RATIO
Creatinine,U: 169.8 mg/dL
Microalb Creat Ratio: 11.4 mg/g (ref 0.0–30.0)
Microalb, Ur: 19.3 mg/dL — ABNORMAL HIGH (ref 0.0–1.9)

## 2022-03-19 LAB — POCT GLUCOSE (DEVICE FOR HOME USE): Glucose Fasting, POC: 127 mg/dL — AB (ref 70–99)

## 2022-03-19 MED ORDER — BASAGLAR KWIKPEN 100 UNIT/ML ~~LOC~~ SOPN: 20.0000 [IU] | PEN_INJECTOR | Freq: Every day | SUBCUTANEOUS | 3 refills | Status: DC

## 2022-03-19 MED ORDER — BD PEN NEEDLE MINI U/F 31G X 5 MM MISC: 1.0000 | Freq: Every day | 3 refills | Status: DC

## 2022-03-19 MED ORDER — METFORMIN HCL ER 500 MG PO TB24: 500.0000 mg | ORAL_TABLET | Freq: Every day | ORAL | 3 refills | Status: DC

## 2022-03-19 MED ORDER — METFORMIN HCL ER 500 MG PO TB24: 1000.0000 mg | ORAL_TABLET | Freq: Every day | ORAL | 3 refills | Status: DC

## 2022-03-19 NOTE — Patient Instructions (Signed)
Start Metformin 1 tablet daily for 1 week, then increase to 2 tablets daily  Decrease basaglar to 15 units     HOW TO TREAT LOW BLOOD SUGARS (Blood sugar LESS THAN 70 MG/DL) Please follow the RULE OF 15 for the treatment of hypoglycemia treatment (when your (blood sugars are less than 70 mg/dL)   STEP 1: Take 15 grams of carbohydrates when your blood sugar is low, which includes:  3-4 GLUCOSE TABS  OR 3-4 OZ OF JUICE OR REGULAR SODA OR ONE TUBE OF GLUCOSE GEL    STEP 2: RECHECK blood sugar in 15 MINUTES STEP 3: If your blood sugar is still low at the 15 minute recheck --> then, go back to STEP 1 and treat AGAIN with another 15 grams of carbohydrates.

## 2022-08-02 ENCOUNTER — Telehealth: Payer: Self-pay

## 2022-08-02 NOTE — Telephone Encounter (Signed)
Pt called to advise he needed a form completed to start a new job, pt was advised he needed to drop the form off to be completed and signed by the provider.

## 2022-08-03 ENCOUNTER — Telehealth: Payer: Self-pay | Admitting: Internal Medicine

## 2022-08-03 NOTE — Telephone Encounter (Signed)
New message   The patient wife stop by the office to have forms to be completed.   [employer].  Forms placed in MD folder.

## 2022-08-04 NOTE — Telephone Encounter (Signed)
Paperwork has been completed and will be put up front for pick up. Patient has been notified
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
## 2022-09-24 ENCOUNTER — Telehealth: Payer: Self-pay | Admitting: Internal Medicine

## 2022-09-24 ENCOUNTER — Encounter: Payer: Self-pay | Admitting: Internal Medicine

## 2022-09-24 ENCOUNTER — Ambulatory Visit (INDEPENDENT_AMBULATORY_CARE_PROVIDER_SITE_OTHER): Payer: Commercial Managed Care - HMO | Admitting: Internal Medicine

## 2022-09-24 VITALS — BP 140/88 | HR 72 | Ht 69.0 in | Wt 212.0 lb

## 2022-09-24 DIAGNOSIS — N1831 Chronic kidney disease, stage 3a: Secondary | ICD-10-CM | POA: Diagnosis not present

## 2022-09-24 DIAGNOSIS — E785 Hyperlipidemia, unspecified: Secondary | ICD-10-CM | POA: Diagnosis not present

## 2022-09-24 DIAGNOSIS — E1122 Type 2 diabetes mellitus with diabetic chronic kidney disease: Secondary | ICD-10-CM

## 2022-09-24 LAB — POCT GLYCOSYLATED HEMOGLOBIN (HGB A1C): Hemoglobin A1C: 6.4 % — AB (ref 4.0–5.6)

## 2022-09-24 LAB — BASIC METABOLIC PANEL
BUN: 13 mg/dL (ref 6–23)
CO2: 28 mEq/L (ref 19–32)
Calcium: 9.3 mg/dL (ref 8.4–10.5)
Chloride: 105 mEq/L (ref 96–112)
Creatinine, Ser: 1.51 mg/dL — ABNORMAL HIGH (ref 0.40–1.50)
GFR: 56.12 mL/min — ABNORMAL LOW (ref >60.00)
Glucose, Bld: 142 mg/dL — ABNORMAL HIGH (ref 70–99)
Potassium: 4 mEq/L (ref 3.5–5.1)
Sodium: 138 mEq/L (ref 135–145)

## 2022-09-24 MED ORDER — DAPAGLIFLOZIN PROPANEDIOL 5 MG PO TABS: 5.0000 mg | ORAL_TABLET | Freq: Every day | ORAL | 3 refills | Status: DC

## 2022-09-24 MED ORDER — ATORVASTATIN CALCIUM 10 MG PO TABS: 10.0000 mg | ORAL_TABLET | Freq: Every day | ORAL | 3 refills | Status: DC

## 2022-09-24 NOTE — Telephone Encounter (Signed)
Please let the patient know that his kidney function has improved but it continues to be LOWER than normal  As discussed during his office visit, I prefer for him to start the medication that we will work on his diabetes as well as to protect his kidney   He will need to stop the metformin and the insulin, as there is no need for them  Start Farxiga 5 mg, 1 tablet every morning    Thanks

## 2022-09-24 NOTE — Telephone Encounter (Signed)
Notified pt kidney function results and medication instructions. Pt voiced understanding.

## 2022-09-24 NOTE — Patient Instructions (Signed)
Please take Atorvastatin 10 mg daily

## 2023-03-28 ENCOUNTER — Ambulatory Visit: Payer: Commercial Managed Care - HMO | Admitting: Internal Medicine
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
## 2023-08-05 ENCOUNTER — Telehealth: Payer: Self-pay | Admitting: Internal Medicine

## 2023-08-05 NOTE — Telephone Encounter (Signed)
LMTCB to notify patient that his Hector Wallace was stopped at his visit in April of last year.

## 2023-08-05 NOTE — Telephone Encounter (Signed)
New message    Wife Crystal asking for a call back to discuss restart of insulin  insulin Glargine (BASAGLAR KWIKPEN) 100 UNIT/ML [[id number]]   [id number] - [place], [place] - [address] Phone: [phone]

## 2023-08-08 NOTE — Telephone Encounter (Signed)
 LMTCB

## 2023-08-11 ENCOUNTER — Emergency Department (HOSPITAL_COMMUNITY): Payer: Commercial Managed Care - HMO

## 2023-08-11 ENCOUNTER — Encounter (HOSPITAL_COMMUNITY): Payer: Self-pay | Admitting: Emergency Medicine

## 2023-08-11 ENCOUNTER — Inpatient Hospital Stay (HOSPITAL_COMMUNITY)
Admission: EM | Admit: 2023-08-11 | Discharge: 2023-08-15 | DRG: 637 | Disposition: A | Discharge status: Home / Self Care | Payer: Commercial Managed Care - HMO | Attending: Internal Medicine | Admitting: Internal Medicine

## 2023-08-11 DIAGNOSIS — Z91014 Allergy to mammalian meats: Secondary | ICD-10-CM

## 2023-08-11 DIAGNOSIS — Z6831 Body mass index (BMI) 31.0-31.9, adult: Secondary | ICD-10-CM

## 2023-08-11 DIAGNOSIS — R739 Hyperglycemia, unspecified: Secondary | ICD-10-CM | POA: Diagnosis not present

## 2023-08-11 DIAGNOSIS — R0902 Hypoxemia: Secondary | ICD-10-CM | POA: Diagnosis not present

## 2023-08-11 DIAGNOSIS — Z888 Allergy status to other drugs, medicaments and biological substances status: Secondary | ICD-10-CM

## 2023-08-11 DIAGNOSIS — J9811 Atelectasis: Secondary | ICD-10-CM | POA: Diagnosis present

## 2023-08-11 DIAGNOSIS — I10 Essential (primary) hypertension: Secondary | ICD-10-CM | POA: Diagnosis present

## 2023-08-11 DIAGNOSIS — R112 Nausea with vomiting, unspecified: Secondary | ICD-10-CM | POA: Diagnosis not present

## 2023-08-11 DIAGNOSIS — I129 Hypertensive chronic kidney disease with stage 1 through stage 4 chronic kidney disease, or unspecified chronic kidney disease: Secondary | ICD-10-CM | POA: Diagnosis present

## 2023-08-11 DIAGNOSIS — E1122 Type 2 diabetes mellitus with diabetic chronic kidney disease: Secondary | ICD-10-CM | POA: Diagnosis present

## 2023-08-11 DIAGNOSIS — J9601 Acute respiratory failure with hypoxia: Secondary | ICD-10-CM | POA: Diagnosis present

## 2023-08-11 DIAGNOSIS — E785 Hyperlipidemia, unspecified: Secondary | ICD-10-CM | POA: Diagnosis present

## 2023-08-11 DIAGNOSIS — E1110 Type 2 diabetes mellitus with ketoacidosis without coma: Secondary | ICD-10-CM | POA: Diagnosis not present

## 2023-08-11 DIAGNOSIS — R000 Tachycardia, unspecified: Secondary | ICD-10-CM | POA: Diagnosis not present

## 2023-08-11 DIAGNOSIS — Z794 Long term (current) use of insulin: Secondary | ICD-10-CM

## 2023-08-11 DIAGNOSIS — N179 Acute kidney failure, unspecified: Secondary | ICD-10-CM | POA: Diagnosis not present

## 2023-08-11 DIAGNOSIS — R54 Age-related physical debility: Secondary | ICD-10-CM | POA: Diagnosis present

## 2023-08-11 DIAGNOSIS — J112 Influenza due to unidentified influenza virus with gastrointestinal manifestations: Secondary | ICD-10-CM | POA: Diagnosis not present

## 2023-08-11 DIAGNOSIS — J111 Influenza due to unidentified influenza virus with other respiratory manifestations: Principal | ICD-10-CM

## 2023-08-11 DIAGNOSIS — Z79899 Other long term (current) drug therapy: Secondary | ICD-10-CM

## 2023-08-11 DIAGNOSIS — E876 Hypokalemia: Secondary | ICD-10-CM | POA: Diagnosis present

## 2023-08-11 DIAGNOSIS — N1831 Chronic kidney disease, stage 3a: Secondary | ICD-10-CM

## 2023-08-11 DIAGNOSIS — Z87891 Personal history of nicotine dependence: Secondary | ICD-10-CM

## 2023-08-11 DIAGNOSIS — E66811 Obesity, class 1: Secondary | ICD-10-CM | POA: Diagnosis present

## 2023-08-11 DIAGNOSIS — J1000 Influenza due to other identified influenza virus with unspecified type of pneumonia: Secondary | ICD-10-CM | POA: Diagnosis present

## 2023-08-11 LAB — BASIC METABOLIC PANEL
Anion gap: 21 — ABNORMAL HIGH (ref 5–15)
Anion gap: 18 — ABNORMAL HIGH (ref 5–15)
Anion gap: 13 (ref 5–15)
BUN: 53 mg/dL — ABNORMAL HIGH (ref 6–20)
BUN: 46 mg/dL — ABNORMAL HIGH (ref 6–20)
BUN: 42 mg/dL — ABNORMAL HIGH (ref 6–20)
CO2: 12 mmol/L — ABNORMAL LOW (ref 22–32)
CO2: 15 mmol/L — ABNORMAL LOW (ref 22–32)
CO2: 18 mmol/L — ABNORMAL LOW (ref 22–32)
Calcium: 9.5 mg/dL (ref 8.9–10.3)
Calcium: 9.1 mg/dL (ref 8.9–10.3)
Calcium: 9.1 mg/dL (ref 8.9–10.3)
Chloride: 100 mmol/L (ref 98–111)
Chloride: 107 mmol/L (ref 98–111)
Chloride: 109 mmol/L (ref 98–111)
Creatinine, Ser: 2.62 mg/dL — ABNORMAL HIGH (ref 0.61–1.24)
Creatinine, Ser: 2.26 mg/dL — ABNORMAL HIGH (ref 0.61–1.24)
Creatinine, Ser: 1.9 mg/dL — ABNORMAL HIGH (ref 0.61–1.24)
GFR, Estimated: 30 mL/min — ABNORMAL LOW (ref >60)
GFR, Estimated: 36 mL/min — ABNORMAL LOW (ref >60)
GFR, Estimated: 44 mL/min — ABNORMAL LOW (ref >60)
Glucose, Bld: 577 mg/dL (ref 70–99)
Glucose, Bld: 378 mg/dL — ABNORMAL HIGH (ref 70–99)
Glucose, Bld: 257 mg/dL — ABNORMAL HIGH (ref 70–99)
Potassium: 5.5 mmol/L — ABNORMAL HIGH (ref 3.5–5.1)
Potassium: 4.4 mmol/L (ref 3.5–5.1)
Potassium: 4 mmol/L (ref 3.5–5.1)
Sodium: 133 mmol/L — ABNORMAL LOW (ref 135–145)
Sodium: 140 mmol/L (ref 135–145)
Sodium: 140 mmol/L (ref 135–145)

## 2023-08-11 LAB — CBC WITH DIFFERENTIAL/PLATELET
Abs Immature Granulocytes: 0.02 10*3/uL (ref 0.00–0.07)
Basophils Absolute: 0 10*3/uL (ref 0.0–0.1)
Basophils Relative: 0 %
Eosinophils Absolute: 0 10*3/uL (ref 0.0–0.5)
Eosinophils Relative: 0 %
HCT: 58.6 % — ABNORMAL HIGH (ref 39.0–52.0)
Hemoglobin: 20.4 g/dL — ABNORMAL HIGH (ref 13.0–17.0)
Immature Granulocytes: 0 %
Lymphocytes Relative: 7 %
Lymphs Abs: 0.4 10*3/uL — ABNORMAL LOW (ref 0.7–4.0)
MCH: 30.9 pg (ref 26.0–34.0)
MCHC: 34.8 g/dL (ref 30.0–36.0)
MCV: 88.7 fL (ref 80.0–100.0)
Monocytes Absolute: 0.7 10*3/uL (ref 0.1–1.0)
Monocytes Relative: 13 %
Neutro Abs: 3.9 10*3/uL (ref 1.7–7.7)
Neutrophils Relative %: 80 %
Platelets: 214 10*3/uL (ref 150–400)
RBC: 6.61 MIL/uL — ABNORMAL HIGH (ref 4.22–5.81)
RDW: 13.2 % (ref 11.5–15.5)
Smear Review: NORMAL
WBC: 5 10*3/uL (ref 4.0–10.5)
nRBC: 0 % (ref 0.0–0.2)

## 2023-08-11 LAB — CBG MONITORING, ED
Glucose-Capillary: >600 mg/dL (ref 70–99)
Glucose-Capillary: 575 mg/dL (ref 70–99)
Glucose-Capillary: 520 mg/dL (ref 70–99)
Glucose-Capillary: 373 mg/dL — ABNORMAL HIGH (ref 70–99)
Glucose-Capillary: 241 mg/dL — ABNORMAL HIGH (ref 70–99)
Glucose-Capillary: 251 mg/dL — ABNORMAL HIGH (ref 70–99)
Glucose-Capillary: 248 mg/dL — ABNORMAL HIGH (ref 70–99)

## 2023-08-11 LAB — URINALYSIS, ROUTINE W REFLEX MICROSCOPIC
Bacteria, UA: NONE SEEN
Bilirubin Urine: NEGATIVE
Glucose, UA: >=500 mg/dL — AB
Ketones, ur: 20 mg/dL — AB
Leukocytes,Ua: NEGATIVE
Nitrite: NEGATIVE
Protein, ur: >=300 mg/dL — AB
Specific Gravity, Urine: 1.021 (ref 1.005–1.030)
pH: 5 (ref 5.0–8.0)

## 2023-08-11 LAB — I-STAT CHEM 8, ED
BUN: 53 mg/dL — ABNORMAL HIGH (ref 6–20)
Calcium, Ion: 1.18 mmol/L (ref 1.15–1.40)
Chloride: 106 mmol/L (ref 98–111)
Creatinine, Ser: 2.5 mg/dL — ABNORMAL HIGH (ref 0.61–1.24)
Glucose, Bld: 563 mg/dL (ref 70–99)
HCT: 63 % — ABNORMAL HIGH (ref 39.0–52.0)
Hemoglobin: 21.4 g/dL (ref 13.0–17.0)
Potassium: 6.1 mmol/L — ABNORMAL HIGH (ref 3.5–5.1)
Sodium: 134 mmol/L — ABNORMAL LOW (ref 135–145)
TCO2: 16 mmol/L — ABNORMAL LOW (ref 22–32)

## 2023-08-11 LAB — BETA-HYDROXYBUTYRIC ACID
Beta-Hydroxybutyric Acid: 5.32 mmol/L — ABNORMAL HIGH (ref 0.05–0.27)
Beta-Hydroxybutyric Acid: 4.5 mmol/L — ABNORMAL HIGH (ref 0.05–0.27)

## 2023-08-11 LAB — GLUCOSE, CAPILLARY: Glucose-Capillary: 251 mg/dL — ABNORMAL HIGH (ref 70–99)

## 2023-08-11 LAB — STREP PNEUMONIAE URINARY ANTIGEN: Strep Pneumo Urinary Antigen: NEGATIVE

## 2023-08-11 LAB — PROCALCITONIN: Procalcitonin: 0.88 ng/mL

## 2023-08-11 LAB — OSMOLALITY: Osmolality: 349 mosm/kg (ref 275–295)

## 2023-08-11 MED ORDER — LACTATED RINGERS IV SOLN: INTRAVENOUS | Status: AC

## 2023-08-11 MED ORDER — ACETAMINOPHEN 325 MG PO TABS
650.0000 mg | ORAL_TABLET | Freq: Four times a day (QID) | ORAL | Status: DC | PRN
  Administered 2023-08-12 – 2023-08-14 (×2): 650 mg via ORAL
  Filled 2023-08-11 (×2): qty 2

## 2023-08-11 MED ORDER — DEXTROSE 50 % IV SOLN
0.0000 mL | INTRAVENOUS | Status: DC | PRN
Start: 2023-08-11 — End: 2023-08-15

## 2023-08-11 MED ORDER — OSELTAMIVIR PHOSPHATE 30 MG PO CAPS
30.0000 mg | ORAL_CAPSULE | Freq: Two times a day (BID) | ORAL | Status: AC
  Administered 2023-08-12 – 2023-08-14 (×6): 30 mg via ORAL
  Filled 2023-08-11 (×6): qty 1

## 2023-08-11 MED ORDER — SODIUM CHLORIDE 0.9 % IV SOLN
1.0000 g | Freq: Once | INTRAVENOUS | Status: AC
  Administered 2023-08-11: 1 g via INTRAVENOUS
  Filled 2023-08-11: qty 10

## 2023-08-11 MED ORDER — CHLORHEXIDINE GLUCONATE CLOTH 2 % EX PADS
6.0000 | MEDICATED_PAD | Freq: Every day | CUTANEOUS | Status: DC
  Administered 2023-08-11 – 2023-08-12 (×2): 6 via TOPICAL

## 2023-08-11 MED ORDER — LACTATED RINGERS IV BOLUS
2000.0000 mL | INTRAVENOUS | Status: AC
  Administered 2023-08-11: 2000 mL via INTRAVENOUS

## 2023-08-11 MED ORDER — ONDANSETRON HCL 4 MG/2ML IJ SOLN: 4.0000 mg | Freq: Four times a day (QID) | INTRAMUSCULAR | Status: DC | PRN

## 2023-08-11 MED ORDER — DEXTROSE IN LACTATED RINGERS 5 % IV SOLN: INTRAVENOUS | Status: AC

## 2023-08-11 MED ORDER — ORAL CARE MOUTH RINSE: 15.0000 mL | OROMUCOSAL | Status: DC | PRN

## 2023-08-11 MED ORDER — INSULIN REGULAR(HUMAN) IN NACL 100-0.9 UT/100ML-% IV SOLN
INTRAVENOUS | Status: DC
  Administered 2023-08-11: 11.5 [IU]/h via INTRAVENOUS
  Administered 2023-08-12: 6.5 [IU]/h via INTRAVENOUS
  Filled 2023-08-11 (×2): qty 100

## 2023-08-11 MED ORDER — ONDANSETRON HCL 4 MG/2ML IJ SOLN
4.0000 mg | Freq: Once | INTRAMUSCULAR | Status: AC
  Administered 2023-08-11: 4 mg via INTRAVENOUS
  Filled 2023-08-11: qty 2

## 2023-08-11 MED ORDER — SODIUM CHLORIDE 0.9 % IV SOLN
2.0000 g | INTRAVENOUS | Status: AC
  Administered 2023-08-12 – 2023-08-15 (×4): 2 g via INTRAVENOUS
  Filled 2023-08-11 (×4): qty 20

## 2023-08-11 MED ORDER — SODIUM CHLORIDE 0.9 % IV SOLN
500.0000 mg | Freq: Once | INTRAVENOUS | Status: AC
  Administered 2023-08-11: 500 mg via INTRAVENOUS
  Filled 2023-08-11: qty 5

## 2023-08-11 MED ORDER — ACETAMINOPHEN 650 MG RE SUPP: 650.0000 mg | Freq: Four times a day (QID) | RECTAL | Status: DC | PRN

## 2023-08-11 MED ORDER — ONDANSETRON HCL 4 MG PO TABS: 4.0000 mg | ORAL_TABLET | Freq: Four times a day (QID) | ORAL | Status: DC | PRN

## 2023-08-11 MED ORDER — AMLODIPINE BESYLATE 5 MG PO TABS
5.0000 mg | ORAL_TABLET | Freq: Every day | ORAL | Status: DC
  Administered 2023-08-11 – 2023-08-15 (×5): 5 mg via ORAL
  Filled 2023-08-11 (×5): qty 1

## 2023-08-11 MED ORDER — LABETALOL HCL 5 MG/ML IV SOLN
20.0000 mg | Freq: Once | INTRAVENOUS | Status: AC
  Administered 2023-08-11: 20 mg via INTRAVENOUS
  Filled 2023-08-11: qty 4

## 2023-08-11 MED ORDER — METOCLOPRAMIDE HCL 5 MG/ML IJ SOLN
10.0000 mg | Freq: Once | INTRAMUSCULAR | Status: AC
  Administered 2023-08-11: 10 mg via INTRAVENOUS
  Filled 2023-08-11: qty 2

## 2023-08-11 MED ORDER — SODIUM CHLORIDE 0.9 % IV SOLN
500.0000 mg | INTRAVENOUS | Status: AC
  Administered 2023-08-12 – 2023-08-15 (×4): 500 mg via INTRAVENOUS
  Filled 2023-08-11 (×4): qty 5
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
## 2023-08-11 NOTE — ED Triage Notes (Signed)
Pt here from home with c/o weakness , was just dx with flu a few days ago
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
## 2023-08-11 NOTE — ED Provider Notes (Signed)
I provided a substantive portion of the care of this patient.  I personally made/approved the management plan for this patient and take responsibility for the patient management.  EKG Interpretation Date/Time:  Thursday August 11 2023 14:25:12 EST Ventricular Rate:  108 PR Interval:  165 QRS Duration:  87 QT Interval:  304 QTC Calculation: 408 R Axis:   104  Text Interpretation: Sinus tachycardia Right axis deviation Confirmed by Lorre Nick ([id number]) on 08/11/2023 3:42:44 PM   Patient is EKG shows sinus tachycardia.  Patient here with weakness and dehydration after being diagnosed with influenza.  Has evidence of acute kidney injury and dehydration.  Patient will be hydrated.  Chest x-ray shows pneumonia and will start on IV antibiotics.  Patient to be admitted   Lorre Nick, MD 08/11/23 [phone]

## 2023-08-11 NOTE — ED Notes (Signed)
 EDP at [facility]

## 2023-08-12 ENCOUNTER — Other Ambulatory Visit: Payer: Self-pay

## 2023-08-12 ENCOUNTER — Other Ambulatory Visit (HOSPITAL_COMMUNITY): Payer: Self-pay

## 2023-08-12 DIAGNOSIS — J112 Influenza due to unidentified influenza virus with gastrointestinal manifestations: Secondary | ICD-10-CM | POA: Diagnosis not present

## 2023-08-12 DIAGNOSIS — Z794 Long term (current) use of insulin: Secondary | ICD-10-CM | POA: Diagnosis not present

## 2023-08-12 DIAGNOSIS — J9601 Acute respiratory failure with hypoxia: Secondary | ICD-10-CM | POA: Diagnosis not present

## 2023-08-12 DIAGNOSIS — E66811 Obesity, class 1: Secondary | ICD-10-CM | POA: Diagnosis not present

## 2023-08-12 DIAGNOSIS — Z79899 Other long term (current) drug therapy: Secondary | ICD-10-CM | POA: Diagnosis not present

## 2023-08-12 DIAGNOSIS — N179 Acute kidney failure, unspecified: Secondary | ICD-10-CM | POA: Diagnosis not present

## 2023-08-12 DIAGNOSIS — E876 Hypokalemia: Secondary | ICD-10-CM | POA: Diagnosis not present

## 2023-08-12 DIAGNOSIS — Z888 Allergy status to other drugs, medicaments and biological substances status: Secondary | ICD-10-CM | POA: Diagnosis not present

## 2023-08-12 DIAGNOSIS — Z6831 Body mass index (BMI) 31.0-31.9, adult: Secondary | ICD-10-CM | POA: Diagnosis not present

## 2023-08-12 DIAGNOSIS — N1831 Chronic kidney disease, stage 3a: Secondary | ICD-10-CM | POA: Diagnosis not present

## 2023-08-12 DIAGNOSIS — I129 Hypertensive chronic kidney disease with stage 1 through stage 4 chronic kidney disease, or unspecified chronic kidney disease: Secondary | ICD-10-CM | POA: Diagnosis not present

## 2023-08-12 DIAGNOSIS — J9811 Atelectasis: Secondary | ICD-10-CM | POA: Diagnosis not present

## 2023-08-12 DIAGNOSIS — E1122 Type 2 diabetes mellitus with diabetic chronic kidney disease: Secondary | ICD-10-CM | POA: Diagnosis not present

## 2023-08-12 DIAGNOSIS — R000 Tachycardia, unspecified: Secondary | ICD-10-CM | POA: Diagnosis not present

## 2023-08-12 DIAGNOSIS — Z87891 Personal history of nicotine dependence: Secondary | ICD-10-CM | POA: Diagnosis not present

## 2023-08-12 DIAGNOSIS — E1110 Type 2 diabetes mellitus with ketoacidosis without coma: Secondary | ICD-10-CM | POA: Diagnosis not present

## 2023-08-12 DIAGNOSIS — J1000 Influenza due to other identified influenza virus with unspecified type of pneumonia: Secondary | ICD-10-CM | POA: Diagnosis not present

## 2023-08-12 DIAGNOSIS — Z91014 Allergy to mammalian meats: Secondary | ICD-10-CM | POA: Diagnosis not present

## 2023-08-12 DIAGNOSIS — E785 Hyperlipidemia, unspecified: Secondary | ICD-10-CM | POA: Diagnosis not present

## 2023-08-12 DIAGNOSIS — R54 Age-related physical debility: Secondary | ICD-10-CM | POA: Diagnosis not present

## 2023-08-12 LAB — GLUCOSE, CAPILLARY
Glucose-Capillary: 137 mg/dL — ABNORMAL HIGH (ref 70–99)
Glucose-Capillary: 164 mg/dL — ABNORMAL HIGH (ref 70–99)
Glucose-Capillary: 176 mg/dL — ABNORMAL HIGH (ref 70–99)
Glucose-Capillary: 191 mg/dL — ABNORMAL HIGH (ref 70–99)
Glucose-Capillary: 209 mg/dL — ABNORMAL HIGH (ref 70–99)
Glucose-Capillary: 210 mg/dL — ABNORMAL HIGH (ref 70–99)
Glucose-Capillary: 218 mg/dL — ABNORMAL HIGH (ref 70–99)
Glucose-Capillary: 219 mg/dL — ABNORMAL HIGH (ref 70–99)
Glucose-Capillary: 220 mg/dL — ABNORMAL HIGH (ref 70–99)
Glucose-Capillary: 224 mg/dL — ABNORMAL HIGH (ref 70–99)
Glucose-Capillary: 241 mg/dL — ABNORMAL HIGH (ref 70–99)
Glucose-Capillary: 244 mg/dL — ABNORMAL HIGH (ref 70–99)
Glucose-Capillary: 269 mg/dL — ABNORMAL HIGH (ref 70–99)
Glucose-Capillary: 277 mg/dL — ABNORMAL HIGH (ref 70–99)
Glucose-Capillary: 287 mg/dL — ABNORMAL HIGH (ref 70–99)
Glucose-Capillary: 375 mg/dL — ABNORMAL HIGH (ref 70–99)

## 2023-08-12 LAB — CBC
HCT: 50 % (ref 39.0–52.0)
Hemoglobin: 16.9 g/dL (ref 13.0–17.0)
MCH: 31.1 pg (ref 26.0–34.0)
MCHC: 33.8 g/dL (ref 30.0–36.0)
MCV: 91.9 fL (ref 80.0–100.0)
Platelets: 178 10*3/uL (ref 150–400)
RBC: 5.44 MIL/uL (ref 4.22–5.81)
RDW: 12.9 % (ref 11.5–15.5)
WBC: 4.6 10*3/uL (ref 4.0–10.5)
nRBC: 0 % (ref 0.0–0.2)

## 2023-08-12 LAB — BASIC METABOLIC PANEL
Anion gap: 14 (ref 5–15)
Anion gap: 12 (ref 5–15)
BUN: 36 mg/dL — ABNORMAL HIGH (ref 6–20)
BUN: 29 mg/dL — ABNORMAL HIGH (ref 6–20)
CO2: 22 mmol/L (ref 22–32)
CO2: 19 mmol/L — ABNORMAL LOW (ref 22–32)
Calcium: 8.4 mg/dL — ABNORMAL LOW (ref 8.9–10.3)
Calcium: 8.8 mg/dL — ABNORMAL LOW (ref 8.9–10.3)
Chloride: 107 mmol/L (ref 98–111)
Chloride: 110 mmol/L (ref 98–111)
Creatinine, Ser: 1.79 mg/dL — ABNORMAL HIGH (ref 0.61–1.24)
Creatinine, Ser: 1.67 mg/dL — ABNORMAL HIGH (ref 0.61–1.24)
GFR, Estimated: 47 mL/min — ABNORMAL LOW (ref >60)
GFR, Estimated: 51 mL/min — ABNORMAL LOW (ref >60)
Glucose, Bld: 227 mg/dL — ABNORMAL HIGH (ref 70–99)
Glucose, Bld: 215 mg/dL — ABNORMAL HIGH (ref 70–99)
Potassium: 3.5 mmol/L (ref 3.5–5.1)
Potassium: 3.5 mmol/L (ref 3.5–5.1)
Sodium: 143 mmol/L (ref 135–145)
Sodium: 141 mmol/L (ref 135–145)

## 2023-08-12 LAB — RESPIRATORY PANEL BY PCR

## 2023-08-12 LAB — HIV ANTIBODY (ROUTINE TESTING W REFLEX): HIV Screen 4th Generation wRfx: NONREACTIVE

## 2023-08-12 LAB — BETA-HYDROXYBUTYRIC ACID: Beta-Hydroxybutyric Acid: 0.28 mmol/L — ABNORMAL HIGH (ref 0.05–0.27)

## 2023-08-12 LAB — MRSA NEXT GEN BY PCR, NASAL: MRSA by PCR Next Gen: NOT DETECTED

## 2023-08-12 MED ORDER — INSULIN ASPART 100 UNIT/ML IJ SOLN
0.0000 [IU] | Freq: Every day | INTRAMUSCULAR | Status: DC
  Administered 2023-08-12: 5 [IU] via SUBCUTANEOUS
  Administered 2023-08-13: 3 [IU] via SUBCUTANEOUS

## 2023-08-12 MED ORDER — INSULIN ASPART 100 UNIT/ML IJ SOLN: 4.0000 [IU] | Freq: Three times a day (TID) | INTRAMUSCULAR | Status: DC

## 2023-08-12 MED ORDER — INSULIN GLARGINE-YFGN 100 UNIT/ML ~~LOC~~ SOLN
20.0000 [IU] | Freq: Every day | SUBCUTANEOUS | Status: DC
  Administered 2023-08-12: 20 [IU] via SUBCUTANEOUS
  Filled 2023-08-12 (×2): qty 0.2

## 2023-08-12 MED ORDER — POTASSIUM CHLORIDE CRYS ER 20 MEQ PO TBCR
40.0000 meq | EXTENDED_RELEASE_TABLET | Freq: Once | ORAL | Status: AC
  Administered 2023-08-12: 40 meq via ORAL
  Filled 2023-08-12: qty 2

## 2023-08-12 MED ORDER — INSULIN ASPART 100 UNIT/ML IJ SOLN
0.0000 [IU] | Freq: Three times a day (TID) | INTRAMUSCULAR | Status: DC
  Administered 2023-08-13 (×2): 7 [IU] via SUBCUTANEOUS
  Administered 2023-08-13: 3 [IU] via SUBCUTANEOUS
  Administered 2023-08-14 – 2023-08-15 (×5): 2 [IU] via SUBCUTANEOUS
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
## 2023-08-12 NOTE — Progress Notes (Signed)
   08/12/23 1248  TOC Brief Assessment  Insurance and Status Reviewed  Patient has primary care physician Yes  Home environment has been reviewed Home w/ family  Prior level of function: Independent  Prior/Current Home Services No current home services  Social Drivers of Health Review SDOH reviewed no interventions necessary  Readmission risk has been reviewed Yes  Transition of care needs transition of care needs identified, TOC will continue to follow
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
## 2023-08-12 NOTE — Inpatient Diabetes Management (Addendum)
Inpatient Diabetes Program Recommendations  AACE/ADA: New Consensus Statement on Inpatient Glycemic Control (2015)  Target Ranges:  Prepandial:   less than 140 mg/dL      Peak postprandial:   less than 180 mg/dL (1-2 hours)      Critically ill patients:  140 - 180 mg/dL    Latest Reference Range & Units 08/11/23 13:22 08/11/23 18:45 08/12/23 03:06  Beta-Hydroxybutyric Acid 0.05 - 0.27 mmol/L 5.32 (H) 4.50 (H) 0.28 (H)  (H): Data is abnormally high  Latest Reference Range & Units 08/11/23 13:16  Sodium 135 - 145 mmol/L 133 (L)  Potassium 3.5 - 5.1 mmol/L 5.5 (H)  Chloride 98 - 111 mmol/L 100  CO2 22 - 32 mmol/L 12 (L)  Glucose 70 - 99 mg/dL 324 (HH)  BUN 6 - 20 mg/dL 53 (H)  Creatinine 4.01 - 1.24 mg/dL 0.27 (H)  Calcium 8.9 - 10.3 mg/dL 9.5  Anion gap 5 - 15  21 (H)    Latest Reference Range & Units 08/11/23 13:17 08/11/23 15:11 08/11/23 17:12 08/11/23 18:43 08/11/23 19:53 08/11/23 21:08 08/11/23 22:23 08/11/23 23:40 08/12/23 00:49 08/12/23 02:08  Glucose-Capillary 70 - 99 mg/dL >253 (HH) 664 (HH)  IV Insulin Drip Started @1641  520 (HH) 373 (H) 241 (H) 251 (H) 248 (H) 251 (H) 241 (H) 244 (H)    Latest Reference Range & Units 08/12/23 03:09 08/12/23 04:15 08/12/23 05:18 08/12/23 06:24  Glucose-Capillary 70 - 99 mg/dL 403 (H) 474 (H) 259 (H) 164 (H)  IV Insulin Drip Running  (H): Data is abnormally high   Admit with: DKA/ Influenza +  History: DM, CKD3  Home DM Meds: Farxiga 5 mg daily  Current Orders: IV Insulin Drip     Semglee 20 units daily     Novolog Sensitive Correction Scale/ SSI (0-9 units) TID AC + HS     Novolog 4 units TID with meals     MD- Note pt was traking Humalog and Basaglar insulins last year (d/c'd by ENDO in April 2024) Note pt transitioning to SQ Insulin this AM Current A1c Pending  Will likely need Basal Insulin back at time of d/c and needs follow up with PCP  I sent a message to OP pharmacy to request check on insulin pricing--Pt has not  paid insurance premium so he will not be able to get insulin covered at time of d/c.  I will give pt a coupon so that he can get Basaglar insulin (long-acting) and/or Humalog for $35 per insulin   ENDO: Dr. Lonzo Cloud [facility] Last Seen 09/24/2022--A1c at that visit was 6.4% Instructions given at that visit: Stop Metformin Stop Basaglar Start Farxiga 5 mg daily     Addendum 11:30am--Met w/ pt at bedside.  He appeared quiet and was not very interactive with me but he did answer all my questions.  Pt confirmed he stopped Basaglar insulin and Metformin in April 2024 as his ENDO stopped them due to his being down to 6.4%.  Marcelline Deist was started at that visit.  Pt told me he has not been taking his Eduard Roux how long ago he stopped--unsure why he stopped.  Has CBG meter at home but told me he needs strips and batteries.  I told pt he needs to buy the batteries but we can give him a Rx for more strips.  I informed pt that OP pharmacy told me he has not paid insurance premium so he will not be able to get insulin covered at time of d/c.  It  may be less expensive for pt to purchase a CBG meter and supplies OTC at [facility] to test CBGs.  I was able to print and give pt a coupon for [employer] and Humalog insulins--with the coupon, these 2 insulins will be $35 a piece.  Pt told me he has follow up appt with ENDO March 27th.  Also discussed with patient diagnosis of DKA (pathophysiology), treatment of DKA, lab results, and transition plan to SQ insulin regimen.  Discussed w/ pt that he may need to restart insulin at home and he stated he was OK with that.  Did not have any questions about how to restart Insulin at home--Prefers insulin pens.  I also called pt's wife at RN's request and reviewed all of the above with her as well.  Sent [place] to Dr. Jonathon Bellows to request Basaglar and Humalog for home since pt has Coupon.    --Will follow patient during hospitalization--  Ambrose Finland  RN, MSN, CDCES Diabetes Coordinator Inpatient Glycemic Control Team Team Pager: [phone] (8a-5p)

## 2023-08-13 DIAGNOSIS — E1110 Type 2 diabetes mellitus with ketoacidosis without coma: Secondary | ICD-10-CM | POA: Diagnosis not present

## 2023-08-13 LAB — BASIC METABOLIC PANEL
Anion gap: 9 (ref 5–15)
BUN: 28 mg/dL — ABNORMAL HIGH (ref 6–20)
CO2: 23 mmol/L (ref 22–32)
Calcium: 8.7 mg/dL — ABNORMAL LOW (ref 8.9–10.3)
Chloride: 108 mmol/L (ref 98–111)
Creatinine, Ser: 1.95 mg/dL — ABNORMAL HIGH (ref 0.61–1.24)
GFR, Estimated: 43 mL/min — ABNORMAL LOW (ref >60)
Glucose, Bld: 310 mg/dL — ABNORMAL HIGH (ref 70–99)
Potassium: 4.4 mmol/L (ref 3.5–5.1)
Sodium: 140 mmol/L (ref 135–145)

## 2023-08-13 LAB — GLUCOSE, CAPILLARY
Glucose-Capillary: 305 mg/dL — ABNORMAL HIGH (ref 70–99)
Glucose-Capillary: 345 mg/dL — ABNORMAL HIGH (ref 70–99)
Glucose-Capillary: 201 mg/dL — ABNORMAL HIGH (ref 70–99)
Glucose-Capillary: 265 mg/dL — ABNORMAL HIGH (ref 70–99)

## 2023-08-13 MED ORDER — INSULIN GLARGINE-YFGN 100 UNIT/ML ~~LOC~~ SOLN
30.0000 [IU] | Freq: Every day | SUBCUTANEOUS | Status: DC
  Filled 2023-08-13: qty 0.3

## 2023-08-13 MED ORDER — INSULIN ASPART 100 UNIT/ML IJ SOLN
4.0000 [IU] | Freq: Three times a day (TID) | INTRAMUSCULAR | Status: DC
  Administered 2023-08-13 (×2): 4 [IU] via SUBCUTANEOUS

## 2023-08-13 MED ORDER — SODIUM CHLORIDE 0.9 % IV SOLN: INTRAVENOUS | Status: DC

## 2023-08-13 MED ORDER — INSULIN ASPART 100 UNIT/ML IJ SOLN: 4.0000 [IU] | Freq: Once | INTRAMUSCULAR | Status: DC

## 2023-08-13 MED ORDER — INSULIN ASPART 100 UNIT/ML IJ SOLN: 8.0000 [IU] | Freq: Three times a day (TID) | INTRAMUSCULAR | Status: DC

## 2023-08-13 MED ORDER — INSULIN GLARGINE-YFGN 100 UNIT/ML ~~LOC~~ SOLN
25.0000 [IU] | Freq: Every day | SUBCUTANEOUS | Status: DC
Start: 2023-08-13 — End: 2023-08-14
  Administered 2023-08-13: 25 [IU] via SUBCUTANEOUS
  Filled 2023-08-13 (×2): qty 0.25
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
## 2023-08-14 DIAGNOSIS — E1110 Type 2 diabetes mellitus with ketoacidosis without coma: Secondary | ICD-10-CM | POA: Diagnosis not present

## 2023-08-14 LAB — GLUCOSE, CAPILLARY
Glucose-Capillary: 155 mg/dL — ABNORMAL HIGH (ref 70–99)
Glucose-Capillary: 203 mg/dL — ABNORMAL HIGH (ref 70–99)
Glucose-Capillary: 186 mg/dL — ABNORMAL HIGH (ref 70–99)
Glucose-Capillary: 183 mg/dL — ABNORMAL HIGH (ref 70–99)
Glucose-Capillary: 165 mg/dL — ABNORMAL HIGH (ref 70–99)

## 2023-08-14 LAB — BASIC METABOLIC PANEL
Anion gap: 9 (ref 5–15)
BUN: 24 mg/dL — ABNORMAL HIGH (ref 6–20)
CO2: 22 mmol/L (ref 22–32)
Calcium: 8.3 mg/dL — ABNORMAL LOW (ref 8.9–10.3)
Chloride: 108 mmol/L (ref 98–111)
Creatinine, Ser: 1.49 mg/dL — ABNORMAL HIGH (ref 0.61–1.24)
GFR, Estimated: 59 mL/min — ABNORMAL LOW (ref >60)
Glucose, Bld: 131 mg/dL — ABNORMAL HIGH (ref 70–99)
Potassium: 3.7 mmol/L (ref 3.5–5.1)
Sodium: 139 mmol/L (ref 135–145)

## 2023-08-14 MED ORDER — INSULIN ASPART 100 UNIT/ML IJ SOLN
7.0000 [IU] | Freq: Three times a day (TID) | INTRAMUSCULAR | Status: DC
  Administered 2023-08-14 – 2023-08-15 (×3): 7 [IU] via SUBCUTANEOUS

## 2023-08-14 MED ORDER — GLUCERNA SHAKE PO LIQD
237.0000 mL | Freq: Three times a day (TID) | ORAL | Status: DC
  Administered 2023-08-14: 237 mL via ORAL
  Filled 2023-08-14 (×2): qty 237

## 2023-08-14 MED ORDER — INSULIN GLARGINE-YFGN 100 UNIT/ML ~~LOC~~ SOLN
35.0000 [IU] | Freq: Every day | SUBCUTANEOUS | Status: DC
  Filled 2023-08-14: qty 0.35

## 2023-08-14 MED ORDER — ENSURE ENLIVE PO LIQD: 237.0000 mL | Freq: Two times a day (BID) | ORAL | Status: DC

## 2023-08-14 MED ORDER — INSULIN GLARGINE-YFGN 100 UNIT/ML ~~LOC~~ SOLN
30.0000 [IU] | Freq: Every day | SUBCUTANEOUS | Status: DC
  Administered 2023-08-14 – 2023-08-15 (×2): 30 [IU] via SUBCUTANEOUS
  Filled 2023-08-14 (×2): qty 0.3

## 2023-08-14 MED ORDER — GLUCERNA SHAKE PO LIQD
237.0000 mL | Freq: Three times a day (TID) | ORAL | Status: DC
  Administered 2023-08-14 – 2023-08-15 (×4): 237 mL via ORAL
  Filled 2023-08-14 (×6): qty 237
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
## 2023-08-15 ENCOUNTER — Other Ambulatory Visit (HOSPITAL_COMMUNITY): Payer: Self-pay

## 2023-08-15 DIAGNOSIS — E1110 Type 2 diabetes mellitus with ketoacidosis without coma: Secondary | ICD-10-CM | POA: Diagnosis not present

## 2023-08-15 LAB — GLUCOSE, CAPILLARY
Glucose-Capillary: 176 mg/dL — ABNORMAL HIGH (ref 70–99)
Glucose-Capillary: 156 mg/dL — ABNORMAL HIGH (ref 70–99)

## 2023-08-15 LAB — HEMOGLOBIN A1C
Hgb A1c MFr Bld: >15.5 % — ABNORMAL HIGH (ref 4.8–5.6)
Hgb A1c MFr Bld: >15.5 % — ABNORMAL HIGH (ref 4.8–5.6)
Mean Plasma Glucose: >398 mg/dL
Mean Plasma Glucose: >398 mg/dL

## 2023-08-15 MED ORDER — DAPAGLIFLOZIN PROPANEDIOL 5 MG PO TABS
10.0000 mg | ORAL_TABLET | Freq: Every day | ORAL | 0 refills | Status: DC
  Filled 2023-08-15: qty 60, 30d supply, fill #0

## 2023-08-15 MED ORDER — AMLODIPINE BESYLATE 5 MG PO TABS: 10.0000 mg | ORAL_TABLET | Freq: Every day | ORAL | Status: DC

## 2023-08-15 MED ORDER — AMOXICILLIN-POT CLAVULANATE 875-125 MG PO TABS
1.0000 | ORAL_TABLET | Freq: Two times a day (BID) | ORAL | 0 refills | Status: AC
  Filled 2023-08-15: qty 6, 3d supply, fill #0

## 2023-08-15 MED ORDER — AMLODIPINE BESYLATE 5 MG PO TABS
5.0000 mg | ORAL_TABLET | Freq: Once | ORAL | Status: AC
  Administered 2023-08-15: 5 mg via ORAL
  Filled 2023-08-15: qty 1

## 2023-08-15 MED ORDER — BLOOD GLUCOSE TEST VI STRP
1.0000 | ORAL_STRIP | Freq: Three times a day (TID) | 0 refills | Status: AC
  Filled 2023-08-15: qty 100, 30d supply, fill #0

## 2023-08-15 MED ORDER — ATORVASTATIN CALCIUM 10 MG PO TABS
10.0000 mg | ORAL_TABLET | Freq: Every day | ORAL | 0 refills | Status: DC
  Filled 2023-08-15: qty 30, 30d supply, fill #0

## 2023-08-15 MED ORDER — LANCETS MISC
1.0000 | Freq: Three times a day (TID) | 0 refills | Status: AC
Start: 2023-08-15 — End: ?
  Filled 2023-08-15: qty 100, 30d supply, fill #0

## 2023-08-15 MED ORDER — BASAGLAR KWIKPEN 100 UNIT/ML ~~LOC~~ SOPN
30.0000 [IU] | PEN_INJECTOR | Freq: Every day | SUBCUTANEOUS | 0 refills | Status: DC
  Filled 2023-08-15: qty 9, 30d supply, fill #0

## 2023-08-15 MED ORDER — BLOOD GLUCOSE MONITOR SYSTEM W/DEVICE KIT
1.0000 | PACK | Freq: Three times a day (TID) | 0 refills | Status: DC
  Filled 2023-08-15: qty 1, 30d supply, fill #0

## 2023-08-15 MED ORDER — LANCET DEVICE MISC
1.0000 | Freq: Three times a day (TID) | 0 refills | Status: AC
  Filled 2023-08-15: qty 1, fill #0

## 2023-08-15 MED ORDER — PEN NEEDLES 31G X 5 MM MISC
1.0000 | Freq: Three times a day (TID) | 0 refills | Status: DC
  Filled 2023-08-15: qty 100, 30d supply, fill #0

## 2023-08-15 MED ORDER — INSULIN LISPRO (1 UNIT DIAL) 100 UNIT/ML (KWIKPEN)
7.0000 [IU] | PEN_INJECTOR | Freq: Three times a day (TID) | SUBCUTANEOUS | 0 refills | Status: DC
  Filled 2023-08-15: qty 6, 29d supply, fill #0

## 2023-08-15 MED ORDER — AMLODIPINE BESYLATE 10 MG PO TABS
10.0000 mg | ORAL_TABLET | Freq: Every day | ORAL | 0 refills | Status: DC
  Filled 2023-08-15: qty 30, 30d supply, fill #0

## 2023-08-15 MED ORDER — DAPAGLIFLOZIN PROPANEDIOL 5 MG PO TABS
5.0000 mg | ORAL_TABLET | Freq: Every day | ORAL | 0 refills | Status: AC
  Filled 2023-08-15: qty 30, 30d supply, fill #0
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
## 2023-08-15 NOTE — Progress Notes (Signed)
 AVS reviewed w/ patient and wife who verbalized an understanding. Pt states he does not have health insurance and does not have a PCP, pt also asked if additional meds listed on AVS could also be filled (Farxiga& atorvastatin). Secure chat sent to Dr Jonathon Bellows. Pt to follow up w/ community clinic for med refills&  to become established w/ a PCP. One time prescription sent in by Dr Jonathon Bellows for meds noted above. Additional diabetic & HTN teaching added to AVS

## 2023-08-15 NOTE — Progress Notes (Signed)
 TOC meds in a secure bag delivered to pt in room by this RN.

## 2023-08-15 NOTE — Inpatient Diabetes Management (Addendum)
 Met w/ pt and wife prior to d/c today.  Re-printed and gave new [employer] to pt and wife as the coupon I gave pt on Friday was nowhere in the room.  Explained how to coupon works and that each insulin (basaglar and humalog) will each be $35.  Pt knows how to inject with insulin pens and stated he did not need refresher on insulin injection.  Reviewed discharge doses with pt and wife and healthy CBG goals for home.  Pt stated he needs strips for his CBG meter and batteries.  Discussed with pt and wife that they will need to look at pt's meter when they go home to figure out what kind of batteries the meter needs.  Pt will be given Rx for CBG meter strips.  Discussed with pt and wife that if the strips for his particular meter are very expensive, they can always go to [facility] and buy [employer] meter and supplies for lower cost OTC.  Wife of pt asked me about pt's current A1c.  Unfortunately the A1c still has not resulted yet.  I asked pt and wife to check myChart account for A1c results after d/c.  Wife and pt appreciative of info.      --Will follow patient during hospitalization--  Ambrose Finland RN, MSN, CDCES Diabetes Coordinator Inpatient Glycemic Control Team Team Pager: [phone] (8a-5p)

## 2023-08-15 NOTE — Progress Notes (Signed)
 Additional TOC meds in  asecure bag delivered to pt in room by this RN- placed in pt belonging bag by this RN per pt request

## 2023-08-15 NOTE — TOC Transition Note (Addendum)
 Transition of Care [facility]) - Discharge Note   Patient Details  Name: Hector Wallace MRN: [id number] Date of Birth: 03/28/1981  Transition of [facility]) CM/SW Contact:  Hector Clam, RN Phone Number: 08/15/2023, 10:47 AM   Clinical Narrative:  spoke to patient about insurance states he is unsure if Hector Wallace is active-per admitting e verified insurance 08/14/23 active.Patient states pharmacy  can bill for his meds.No further CM needs.  -12?44 patient has [facility] PCP appt can make on own. No further CM needs.  MATCH MEDICATION ASSISTANCE CARD Pharmacies please call [phone] for claim processing assistance.  Rx BIN: [id number] Rx Group: [id number] Rx PCN: [id number] Relationship Code: 1 Person Code: 01  Patient ID (MRN): Hector Wallace    Patient Name: Hector Wallace   Patient DOB:1980-12-17   Discharge Date: 08/15/2023  Expiration Date:08/22/2023 (must be filled within 7 days of discharge)    Dear Hector Wallace You have been approved to have the prescriptions written by your discharging physician filled through our [facility] (Medication Assistance Through [facility]) program. This program allows for a one-time (no refills) 34-day supply of selected medications for a low copay amount.  The copay is $3.00 per prescription. For instance, if you have one prescription, you will pay $3.00; for two prescriptions, you pay $6.00; for three prescriptions, you pay $9.00; and so on. Only certain pharmacies are participating in this program with [facility]. You will need to select one of the pharmacies from the attached lists and take your prescriptions, this letter, and your photo ID to one of the participating pharmacies.  We are excited that you are able to use the [facility] program to get your medications. These prescriptions must be filled within 7 days of hospital discharge or they will no longer be valid for the [facility] program. Should you have any problems with your prescriptions please contact your case  management team member at [phone] for [place] or [phone] for [facility].  Thank you, Hector Wallace      Final next level of care: Home/Self Care Barriers to Discharge: No Barriers Identified   Patient Goals and CMS Choice   CMS Medicare.gov Compare Post Acute Care list provided to:: Patient        Discharge Placement                       Discharge Plan and Services Additional resources added to the After Visit Summary for                                       Social Drivers of Health (SDOH) Interventions SDOH Screenings   Food Insecurity: No Food Insecurity (08/11/2023)  Housing: High Risk (08/11/2023)  Transportation Needs: No Transportation Needs (08/11/2023)  Utilities: Not At Risk (08/12/2023)  Tobacco Use: Medium Risk (08/11/2023)     Readmission Risk Interventions     No data to display

## 2023-09-15 ENCOUNTER — Encounter: Payer: Self-pay | Admitting: Internal Medicine

## 2023-09-15 ENCOUNTER — Ambulatory Visit (INDEPENDENT_AMBULATORY_CARE_PROVIDER_SITE_OTHER): Payer: Managed Care, Other (non HMO) | Admitting: Internal Medicine

## 2023-09-15 VITALS — BP 130/72 | HR 82 | Ht 70.0 in | Wt 213.0 lb

## 2023-09-15 DIAGNOSIS — E1122 Type 2 diabetes mellitus with diabetic chronic kidney disease: Secondary | ICD-10-CM

## 2023-09-15 DIAGNOSIS — E785 Hyperlipidemia, unspecified: Secondary | ICD-10-CM

## 2023-09-15 DIAGNOSIS — E1165 Type 2 diabetes mellitus with hyperglycemia: Secondary | ICD-10-CM

## 2023-09-15 DIAGNOSIS — Z794 Long term (current) use of insulin: Secondary | ICD-10-CM

## 2023-09-15 DIAGNOSIS — N1831 Chronic kidney disease, stage 3a: Secondary | ICD-10-CM

## 2023-09-15 LAB — POCT GLUCOSE (DEVICE FOR HOME USE): Glucose Fasting, POC: 133 mg/dL — AB (ref 70–99)

## 2023-09-15 MED ORDER — DEXCOM G7 SENSOR MISC: 1.0000 | 3 refills | Status: AC

## 2023-09-15 MED ORDER — BASAGLAR KWIKPEN 100 UNIT/ML ~~LOC~~ SOPN: 30.0000 [IU] | PEN_INJECTOR | Freq: Every day | SUBCUTANEOUS | 3 refills | Status: DC

## 2023-09-15 MED ORDER — INSULIN LISPRO (1 UNIT DIAL) 100 UNIT/ML (KWIKPEN): 4.0000 [IU] | PEN_INJECTOR | Freq: Three times a day (TID) | SUBCUTANEOUS | 3 refills | Status: DC

## 2023-09-15 MED ORDER — PEN NEEDLES 31G X 5 MM MISC: 1.0000 | Freq: Four times a day (QID) | 3 refills | Status: AC

## 2023-09-15 MED ORDER — DEXCOM G7 SENSOR MISC: 1.0000 | 3 refills | Status: DC
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
## 2023-09-15 NOTE — Patient Instructions (Signed)
 Basaglar 30 units daily  Humalog  4 units with each meal  Humalog correctional insulin: ADD extra units on insulin to your meal-time Humalog  dose if your blood sugars are higher than 170. Use the scale below to help guide you:   Blood sugar before meal Number of units to inject  Less than 170 0 unit  171 -  210 1 units  211 -  250 2 units  251 -  290 3 units  291 -  330 4 units  331 -  370 5 units  371 -  410 6 units   HOW TO TREAT LOW BLOOD SUGARS (Blood sugar LESS THAN 70 MG/DL) Please follow the RULE OF 15 for the treatment of hypoglycemia treatment (when your (blood sugars are less than 70 mg/dL)   STEP 1: Take 15 grams of carbohydrates when your blood sugar is low, which includes:  3-4 GLUCOSE TABS  OR 3-4 OZ OF JUICE OR REGULAR SODA OR ONE TUBE OF GLUCOSE GEL    STEP 2: RECHECK blood sugar in 15 MINUTES STEP 3: If your blood sugar is still low at the 15 minute recheck --> then, go back to STEP 1 and treat AGAIN with another 15 grams of carbohydrates.

## 2023-09-16 ENCOUNTER — Telehealth: Payer: Self-pay | Admitting: Internal Medicine

## 2023-09-16 ENCOUNTER — Telehealth: Payer: Self-pay

## 2023-09-16 ENCOUNTER — Other Ambulatory Visit (HOSPITAL_COMMUNITY): Payer: Self-pay

## 2023-09-16 DIAGNOSIS — Z794 Long term (current) use of insulin: Secondary | ICD-10-CM | POA: Insufficient documentation

## 2023-09-16 MED ORDER — LANTUS SOLOSTAR 100 UNIT/ML ~~LOC~~ SOPN: 30.0000 [IU] | PEN_INJECTOR | Freq: Every day | SUBCUTANEOUS | 3 refills | Status: DC

## 2023-09-16 NOTE — Telephone Encounter (Signed)
 Pharmacy Patient Advocate Encounter   Received notification from Pt Calls Messages that prior authorization for Basaglar is required/requested.   Insurance verification completed.   The patient is insured through [employer] .   Per test claim:  Lantus is preferred by the insurance.  If suggested medication is appropriate, Please send in a new RX and discontinue this one. If not, please advise as to why it's not appropriate so that we may request a Prior Authorization. Please note, some preferred medications may still require a PA.  If the suggested medications have not been trialed and there are no contraindications to their use, the PA will not be submitted, as it will not be approved.   Copay for 30 day supply of Lantus is $4

## 2023-09-16 NOTE — Telephone Encounter (Signed)
 Pharmacy Patient Advocate Encounter   Received notification from Pt Calls Messages that prior authorization for Dexcom G7 sensor is required/requested.   Insurance verification completed.   The patient is insured through [facility] .   Per test claim: PA required; PA submitted to above mentioned insurance via CoverMyMeds Key/confirmation #/EOC [id number] Status is pending

## 2023-09-16 NOTE — Addendum Note (Signed)
 Addended by: Scarlette Shorts on: 09/16/2023 01:09 PM   Modules accepted: Orders

## 2023-09-16 NOTE — Telephone Encounter (Signed)
 Patient stopped by and said that his pharmacy said a prior auth is required for Insulin Glargine (BASAGLAR KWIKPEN) 100 UNIT/ML , insulin lispro (HUMALOG) 100 UNIT/ML KwikPen and Continuous Glucose Sensor (DEXCOM G7 SENSOR) MISC . He said he has been out of his insulin and needs as soon as possible. He said he is having it transferred to [facility]

## 2023-09-16 NOTE — Telephone Encounter (Signed)
 Pharmacy Patient Advocate Encounter   Received notification from Pt Calls Messages that prior authorization for Humalog is required/requested.   Insurance verification completed.   The patient is insured through [employer] .   Per test claim: The current 30 day co-pay is, $4.  No PA needed at this time. This test claim was processed through [facility]- copay amounts may vary at other pharmacies due to pharmacy/plan contracts, or as the patient moves through the different stages of their insurance plan.

## 2023-09-27 NOTE — Telephone Encounter (Signed)
 Pharmacy Patient Advocate Encounter  Received notification from [facility] that Prior Authorization for Dexcom G7 sensor has been APPROVED from 09-16-2023 to 09-15-2024   PA #/Case ID/Reference #: [id number]

## 2023-09-30 ENCOUNTER — Telehealth: Payer: Self-pay

## 2023-09-30 NOTE — Telephone Encounter (Signed)
 Patient made aware that Microalbumin / creatinine urine ratio  test result were calculated incorrectly and the measurement themselves were correct. Per Dr. Lonzo Cloud no action is needed at this time and lab will be repeated at next office visit.

## 2024-01-12 ENCOUNTER — Ambulatory Visit: Admitting: Internal Medicine
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
## 2024-06-08 ENCOUNTER — Encounter: Payer: Self-pay | Admitting: Medical

## 2024-06-08 ENCOUNTER — Ambulatory Visit: Admitting: Medical

## 2024-06-08 VITALS — BP 124/80 | HR 68 | Ht 70.5 in | Wt 230.0 lb

## 2024-06-08 DIAGNOSIS — Z8679 Personal history of other diseases of the circulatory system: Secondary | ICD-10-CM

## 2024-06-08 DIAGNOSIS — Z1211 Encounter for screening for malignant neoplasm of colon: Secondary | ICD-10-CM

## 2024-06-08 DIAGNOSIS — Z125 Encounter for screening for malignant neoplasm of prostate: Secondary | ICD-10-CM | POA: Diagnosis not present

## 2024-06-08 DIAGNOSIS — M25432 Effusion, left wrist: Secondary | ICD-10-CM

## 2024-06-08 DIAGNOSIS — M25532 Pain in left wrist: Secondary | ICD-10-CM

## 2024-06-08 DIAGNOSIS — E785 Hyperlipidemia, unspecified: Secondary | ICD-10-CM | POA: Diagnosis not present

## 2024-06-08 DIAGNOSIS — E1165 Type 2 diabetes mellitus with hyperglycemia: Secondary | ICD-10-CM | POA: Diagnosis not present

## 2024-06-08 DIAGNOSIS — Z794 Long term (current) use of insulin: Secondary | ICD-10-CM

## 2024-06-08 LAB — LIPID PANEL

## 2024-06-08 NOTE — Patient Instructions (Signed)
 Type 2 diabetes mellitus managed with Lantus  and Humalog . Last A1c elevated in February 2025. - Ordered A1c test. - Encouraged regular blood glucose monitoring. - Encouraged follow-up with endocrinologist. -advised updated eye doctor visit and yearly diabetic eye exam  Hyperlipidemia On Lipitor prior, currently out of medication.  - Ordered cholesterol test.  History of hypertension Not on antihypertensive medication. Reports dizziness and lightheadedness with high blood pressure. No recent home monitoring. - Encouraged regular home blood pressure monitoring. - Discussed potential need for antihypertensive medication if blood pressure remains elevated.  Left wrist pain and swelling Intermittent pain and swelling, possible carpal tunnel syndrome, tendinitis, arthritis, gout, or rheumatoid arthritis. - Ordered rheumatoid markers. - Will consider referral to sports medicine if markers are inconclusive.  General Health Maintenance Due for prostate and colon cancer screenings. Prefers colonoscopy. - Ordered PSA test. - Referred for colonoscopy.
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
## 2024-06-09 ENCOUNTER — Ambulatory Visit: Payer: Self-pay | Admitting: Medical

## 2024-06-09 ENCOUNTER — Other Ambulatory Visit: Payer: Self-pay | Admitting: Medical

## 2024-06-09 LAB — RHEUMATOID FACTOR: Rheumatoid fact SerPl-aCnc: 10.3 [IU]/mL

## 2024-06-09 LAB — MICROALBUMIN / CREATININE URINE RATIO
Creatinine, Urine: 111.8 mg/dL
Microalb/Creat Ratio: 316 mg/g{creat} — AB (ref 0–29)
Microalbumin, Urine: 353 ug/mL

## 2024-06-09 LAB — CBC
Hematocrit: 48.3 % (ref 37.5–51.0)
Hemoglobin: 16.3 g/dL (ref 13.0–17.7)
MCH: 31.3 pg (ref 26.6–33.0)
MCHC: 33.7 g/dL (ref 31.5–35.7)
MCV: 93 fL (ref 79–97)
Platelets: 220 x10E3/uL (ref 150–450)
RBC: 5.2 x10E6/uL (ref 4.14–5.80)
RDW: 13.3 % (ref 11.6–15.4)
WBC: 4.6 x10E3/uL (ref 3.4–10.8)

## 2024-06-09 LAB — LIPID PANEL
Cholesterol, Total: 192 mg/dL (ref 100–199)
HDL: 46 mg/dL
LDL CALC COMMENT:: 4.2 ratio (ref 0.0–5.0)
LDL Chol Calc (NIH): 115 mg/dL — AB (ref 0–99)
Triglycerides: 175 mg/dL — AB (ref 0–149)
VLDL Cholesterol Cal: 31 mg/dL (ref 5–40)

## 2024-06-09 LAB — SEDIMENTATION RATE: Sed Rate: 7 mm/h (ref 0–15)

## 2024-06-09 LAB — COMPREHENSIVE METABOLIC PANEL WITH GFR
ALT: 26 IU/L (ref 0–44)
AST: 20 IU/L (ref 0–40)
Albumin: 4.3 g/dL (ref 4.1–5.1)
Alkaline Phosphatase: 61 IU/L (ref 47–123)
BUN/Creatinine Ratio: 13 (ref 9–20)
BUN: 23 mg/dL (ref 6–24)
Bilirubin Total: 0.7 mg/dL (ref 0.0–1.2)
CO2: 23 mmol/L (ref 20–29)
Calcium: 9.4 mg/dL (ref 8.7–10.2)
Chloride: 103 mmol/L (ref 96–106)
Creatinine, Ser: 1.78 mg/dL — AB (ref 0.76–1.27)
Globulin, Total: 2.7 g/dL (ref 1.5–4.5)
Glucose: 162 mg/dL — AB (ref 70–99)
Potassium: 4.5 mmol/L (ref 3.5–5.2)
Sodium: 141 mmol/L (ref 134–144)
Total Protein: 7 g/dL (ref 6.0–8.5)
eGFR: 47 mL/min/1.73 — AB

## 2024-06-09 LAB — PSA: Prostate Specific Ag, Serum: 0.5 ng/mL (ref 0.0–4.0)

## 2024-06-09 LAB — HEMOGLOBIN A1C
Est. average glucose Bld gHb Est-mCnc: 163 mg/dL
Hgb A1c MFr Bld: 7.3 % — AB (ref 4.8–5.6)

## 2024-06-09 LAB — TSH: TSH: 0.715 u[IU]/mL (ref 0.450–4.500)

## 2024-06-09 LAB — ANA

## 2024-06-09 LAB — URIC ACID: Uric Acid: 11.1 mg/dL — AB (ref 3.8–8.4)

## 2024-06-09 MED ORDER — ROSUVASTATIN CALCIUM 10 MG PO TABS: 10.0000 mg | ORAL_TABLET | Freq: Every day | ORAL | 3 refills | Status: AC

## 2024-06-09 MED ORDER — LANTUS SOLOSTAR 100 UNIT/ML ~~LOC~~ SOPN: 32.0000 [IU] | PEN_INJECTOR | Freq: Every day | SUBCUTANEOUS | 3 refills | Status: AC

## 2024-06-09 MED ORDER — COLCHICINE 0.6 MG PO TABS: 0.6000 mg | ORAL_TABLET | Freq: Two times a day (BID) | ORAL | 1 refills | Status: AC

## 2024-06-09 MED ORDER — ALLOPURINOL 100 MG PO TABS: ORAL_TABLET | ORAL | 1 refills | Status: AC

## 2024-06-09 NOTE — Progress Notes (Signed)
 Results through MyChart.  Schedule 55-month follow-up fasting

## 2024-06-11 NOTE — Progress Notes (Signed)
 Results thru my chart

## 2024-07-26 ENCOUNTER — Encounter: Payer: Self-pay | Admitting: Gastroenterology

## 2024-09-06 ENCOUNTER — Ambulatory Visit: Admitting: Medical
